# Patient Record
Sex: Female | Born: 1975
Health system: Southern US, Community
[De-identification: ages and names within clinical notes are randomized; demographics above are authoritative.]

## PROBLEM LIST (undated history)

## (undated) DIAGNOSIS — L709 Acne, unspecified: Secondary | ICD-10-CM

## (undated) HISTORY — PX: BUNIONECTOMY: SHX129

---

## 2000-11-07 ENCOUNTER — Encounter: Payer: Self-pay | Admitting: Family Medicine

## 2000-11-07 ENCOUNTER — Ambulatory Visit (HOSPITAL_COMMUNITY): Admission: RE | Admit: 2000-11-07 | Discharge: 2000-11-07 | Payer: Self-pay | Admitting: Family Medicine

## 2001-06-17 ENCOUNTER — Other Ambulatory Visit: Admission: RE | Admit: 2001-06-17 | Discharge: 2001-06-17 | Payer: Self-pay | Admitting: Obstetrics and Gynecology

## 2002-01-13 ENCOUNTER — Inpatient Hospital Stay (HOSPITAL_COMMUNITY): Admission: RE | Admit: 2002-01-13 | Discharge: 2002-01-14 | Payer: Self-pay | Admitting: Obstetrics and Gynecology

## 2002-07-02 HISTORY — PX: TUBAL LIGATION: SHX77

## 2002-10-16 ENCOUNTER — Ambulatory Visit (HOSPITAL_COMMUNITY): Admission: RE | Admit: 2002-10-16 | Discharge: 2002-10-16 | Payer: Self-pay | Admitting: Obstetrics & Gynecology

## 2005-02-10 ENCOUNTER — Emergency Department (HOSPITAL_COMMUNITY): Admission: EM | Admit: 2005-02-10 | Discharge: 2005-02-10 | Payer: Self-pay | Admitting: Family Medicine

## 2005-12-19 ENCOUNTER — Emergency Department (HOSPITAL_COMMUNITY): Admission: EM | Admit: 2005-12-19 | Discharge: 2005-12-19 | Payer: Self-pay | Admitting: Family Medicine

## 2005-12-20 ENCOUNTER — Ambulatory Visit (HOSPITAL_COMMUNITY): Admission: RE | Admit: 2005-12-20 | Discharge: 2005-12-20 | Payer: Self-pay | Admitting: Family Medicine

## 2006-08-05 ENCOUNTER — Other Ambulatory Visit: Admission: RE | Admit: 2006-08-05 | Discharge: 2006-08-05 | Payer: Self-pay | Admitting: Obstetrics and Gynecology

## 2006-10-08 ENCOUNTER — Ambulatory Visit (HOSPITAL_BASED_OUTPATIENT_CLINIC_OR_DEPARTMENT_OTHER): Admission: RE | Admit: 2006-10-08 | Discharge: 2006-10-08 | Payer: Self-pay | Admitting: Orthopedic Surgery

## 2007-07-17 ENCOUNTER — Ambulatory Visit (HOSPITAL_COMMUNITY): Admission: RE | Admit: 2007-07-17 | Discharge: 2007-07-17 | Payer: Self-pay | Admitting: Family Medicine

## 2008-02-06 ENCOUNTER — Emergency Department (HOSPITAL_COMMUNITY): Admission: EM | Admit: 2008-02-06 | Discharge: 2008-02-06 | Payer: Self-pay | Admitting: Emergency Medicine

## 2008-03-29 ENCOUNTER — Ambulatory Visit (HOSPITAL_COMMUNITY): Admission: RE | Admit: 2008-03-29 | Discharge: 2008-03-29 | Payer: Self-pay | Admitting: Family Medicine

## 2008-08-23 ENCOUNTER — Other Ambulatory Visit: Admission: RE | Admit: 2008-08-23 | Discharge: 2008-08-23 | Payer: Self-pay | Admitting: Obstetrics and Gynecology

## 2009-03-31 ENCOUNTER — Emergency Department (HOSPITAL_COMMUNITY): Admission: EM | Admit: 2009-03-31 | Discharge: 2009-03-31 | Payer: Self-pay | Admitting: Family Medicine

## 2010-11-17 NOTE — H&P (Signed)
[employer]  Patient:    [name], [name] Visit Number: [id number] MRN: [id number]          Service Type: MED Location: 4A A426 01 Attending Physician:  Tilda Burrow Dictated by:   Zerita Boers, C.N.M. Admit Date:  01/13/2002   CC:         [facility]   History and Physical  PROCEDURE:  DELIVERY NOTE  ADMISSION DIAGNOSIS:  Pregnancy at 38 weeks with active labor.  TIME OF DELIVERY:  January 13, 2002, at 10:45 a.m.  Length of first stage of labor was 6 hours and 30 minutes.  Length of second stage of labor was 15 minutes.  Length of third stage of labor was 5 minutes.  DESCRIPTION OF PROCEDURE:  The patient had a normal spontaneous vaginal delivery of a viable female infant. Upon delivery of infant, the infant had good tone, strong cry, pinked up very well.  Upon inspection of perineum, there was a first degree laceration with good hemostasis.  No surgical repair was necessary.  Third stage of labor was actually managed with 20 units of Pitocin and 1000 cc D5 lactated Ringers at a rapid rate.  Placenta was delivered spontaneously via Schultze mechanism.  Three-vessel cord was noted upon inspection.  Estimated blood loss approximately 350 cc.  Infant and mother tolerated the delivery well and were stabilized and transferred out to the postpartum unit in good condition. Dictated by:   Zerita Boers, C.N.M. Attending Physician:  Tilda Burrow DD:  01/13/02 TD:  01/13/02 Job: [phone] [id number]

## 2010-11-17 NOTE — Op Note (Signed)
   NAME:  Jennifer Hansen, Jennifer Hansen                            ACCOUNT NO.:  [id number]   MEDICAL RECORD NO.:  [id number]                   PATIENT TYPE:  AMB   LOCATION:  DAY                                  FACILITY:   PHYSICIAN:  Lazaro Arms, M.D.                DATE OF BIRTH:  1975-10-13   DATE OF PROCEDURE:  10/16/2002  DATE OF DISCHARGE:                                 OPERATIVE REPORT   PREOPERATIVE DIAGNOSES:  Multiparous female, desires permanent  sterilization.   POSTOPERATIVE DIAGNOSES:  Multiparous female, desires permanent  sterilization.   OPERATION/PROCEDURE:  Laparoscopic tubal.   SURGEON:  Lazaro Arms, M.D.   ANESTHESIA:  General endotracheal anesthesia.   FINDINGS:  The patient had normal uterus, tubes and ovaries.   DESCRIPTION OF PROCEDURE:  The patient was taken to the operating room and  placed in the supine position where she underwent general endotracheal  anesthesia.  She was placed in the low lithotomy position.  The urethra and  perineum and the vagina were prepped in the usual fashion.  A Hulka  tenaculum was placed in the uterus for manipulation and incision was made in  the umbilicus.  A Veress needle was used and abdomen was insufflated.  A 10-  11 trocar was placed in the  peritoneal cavity with one Scherman under direct  visualization without difficulty.  With a nonbladed obturator, the tubes  were identified, they were burned with no resistance and beyond using  electrocautery unit.  There was good hemostasis bilaterally.  Approximately  a 3 cm segment in the distal isthmic ampulla region of each tube was burned.  The gas was allowed to escape.  The instrument was removed and fascia was  closed with a single 0 Vicryl suture.  The skin was closed using Dermabond.  Marcaine 0.5% with epinephrine 1:200,000 was injected in the subcu for  postop pain relief.  The patient was awakened from anesthesia and taken to  the recovery room in good and stable  condition.  All counts were correct.                                               Lazaro Arms, M.D.    Loraine Maple  D:  10/16/2002  T:  10/16/2002  Job:  [id number]

## 2010-11-17 NOTE — H&P (Signed)
[facility]  Patient:    Jennifer Hansen, Jennifer Hansen Visit Number: [id number] MRN: [id number]          Service Type: Attending:  Christin Bach, M.D. Dictated by:   Zerita Boers, C.N.M. Adm. Date:  01/13/02   CC:         [facility]   History and Physical  REASON FOR ADMISSION:  Pregnancy at 38 weeks in active labor.  HISTORY OF PRESENT ILLNESS:  The patient is a 35 year old, Gravida 2, Para 1 with an EDC of January 26, 2002 which is consistent with a six week ultrasound, who started having contractions at 4 a.m. this morning.  She presents to the hospital 6 cm, completely effaced, 0 station, bulging membranes in active labor.  Fetal heart rate pattern is stable.  MEDICAL HISTORY:  Negative.  SURGICAL HISTORY:  Negative.  ALLERGIES:  She has no known allergies.  SOCIAL HISTORY:  She is married.  Her husband and family are supportive.  FAMILY HISTORY:  Positive for diabetes.  PRENATAL COURSE:  Essentially uneventful and high prior pregnancy.  At time of labor, she had a three hour labor with spontaneous vaginal delivery but did have a 10% abruption on exam of placenta post delivery.  PHYSICAL EXAMINATION:  VITAL SIGNS:  Stable.  Weight is approximately 162 pounds.  PELVIC:  Cervix is now 7 to 8 cm and fetal heart rate pattern is stable with accelerations noted.  LABORATORY DATA:  Blood type is A positive.  UDS is negative.  Rubella is immune. Hepatitis B surface antigen negative.  HIV negative.  Pap smear is class I.  GC and Chlamydia are both negative.  MS-AFP within normal limits.  PLAN:  We are going to admit.  Expect vaginal delivery. Dictated by:   Zerita Boers, C.N.M. Attending:  Christin Bach, M.D. DD:  01/13/02 TD:  01/13/02 Job: [id number]

## 2010-11-17 NOTE — Op Note (Signed)
NAME:  Jennifer Hansen, Jennifer Hansen                  ACCOUNT NO.:  [id number]   MEDICAL RECORD NO.:  [id number]          PATIENT TYPE:  AMB   LOCATION:  DSC                          FACILITY:  [facility]   PHYSICIAN:  Leonides Grills, M.D.     DATE OF BIRTH:  30-Mar-1976   DATE OF PROCEDURE:  10/08/2006  DATE OF DISCHARGE:                               OPERATIVE REPORT   PREOPERATIVE DIAGNOSIS:  Bilateral hallucis valgus.   POSTOPERATIVE DIAGNOSIS:  Bilateral hallucis valgus.   OPERATION:  1. Bilateral chevron bunionectomy.  2. Stress x-rays bilateral feet.   ANESTHESIA:  General.   SURGEON:  Leonides Grills, M.D.   ASSISTANT:  None.   COMPLICATIONS:  None.   DISPOSITION:  Stable to the PR.   INDICATIONS:  This is a 35 year old female with bilateral hallucis  valgus pain that was interfering with her life. __________  despite  wearing wider toe box shoes and occasional anti-inflammatories.  She was  consented for the above procedure.  All risks of infection,  neurovascular injury, nonunion, malunion, hardware irritation, hardware  failure, persistent pain, worse pain, prolonged recovery, stiffness,  arthritis, recurrence of hallucis valgus and hallux varus were all  explained.  Questions were encouraged and answered.   OPERATION:  The patient was brought to the operating room and placed in  the supine position, after adequate general endotracheal tube anesthesia  was administered, as well as Ancef 1 gram IV piggyback.  The bilateral  lower extremities were prepped and draped in a sterile manner, over a  proximally placed thigh tourniquet.  The limb was gravity exsanguinated.  The tourniquet was elevated to 290 mmHg.  A longitudinal incision  midline over the medial aspect of the left great toe MTP joint was then  made.  Dissection was carried down through the skin.  Hemostasis was  obtained.  Neurovascular structures identified both superiorly and  inferiorly, and protected throughout the case.   L-shaped capsulotomy was  then made.  A simple bunionectomy was then performed with a sagittal  saw.  Rocky Link Johnson's ridge was then rounded off with a rongeur.  The  lateral capsule was then released with a curved Beaver blade.  Center of  the head was identified.  Soft tissue was then protected both superiorly  and inferiorly, and a Chevron osteotomy was then created with the  sagittal saw.  The head was then transected by approximately 3-4 mm  laterally and then affixed with a 2.5-mm fully threaded cortical set  screw, using a 1.5-mm drill hole respectively.  This was countersunk.  This had excellent purchase and maintenance of the correction.  The  redundant bone medially was then trimmed off with the sagittal saw.  The  joint and area were copiously irrigated with normal saline.  The capsule  was then advanced both superiorly and proximally, and reconstructed with  2-0 Vicryl stitch.  This had an Conservation officer, historic buildings.  Range of motion of  the toe was excellent.  Stress x-rays were obtained in the AP and  lateral planes; showed no gross motion across the chevron site.  The  chevron osteotomy site fixation and proposition with alignment of the  sesamoids and MTP joint as well.  Tourniquet was deflated.  Hemostasis  was obtained.  The wound was copiously irrigated with normal saline.  The skin was closed with a 4-0 nylon stitch.  Sterile dressing was  applied.   The same exact procedure was performed to the right side.  Once  bilateral dressings were applied, hard sole shoes were applied.  The  patient was stable to the PR.   POSTOPERATIVE COURSE:  The patient is to follow up in 2 weeks.  At that  time we will remove the dressing as well as sutures.  We will place a  blunt silicone bunion pad between the first and second toes; larger  size.  Then start active-passive range of motion exercises of the great  toe MTP joint.  She is to remain heel weightbearing for a total of 6  weeks, and  then weightbearing as tolerated in a hard sole shoe for  additional 2 more weeks.  At 2 months postop, she will go into a normal  shoe and discontinue the bunion pad.  Elevation and ice were encouraged.      Leonides Grills, M.D.  Electronically Signed     PB/MEDQ  D:  10/08/2006  T:  10/09/2006  Job:  [id number]

## 2010-11-21 ENCOUNTER — Other Ambulatory Visit: Payer: Self-pay | Admitting: Adult Health

## 2010-11-21 ENCOUNTER — Other Ambulatory Visit (HOSPITAL_COMMUNITY)
Admission: RE | Admit: 2010-11-21 | Discharge: 2010-11-21 | Disposition: A | Discharge status: Home / Self Care | Payer: Self-pay | Source: Ambulatory Visit | Attending: Obstetrics and Gynecology | Admitting: Obstetrics and Gynecology

## 2010-11-21 ENCOUNTER — Encounter: Payer: Self-pay | Admitting: Adult Health

## 2010-11-21 DIAGNOSIS — Z01419 Encounter for gynecological examination (general) (routine) without abnormal findings: Secondary | ICD-10-CM | POA: Insufficient documentation

## 2010-11-21 NOTE — Progress Notes (Signed)
Subjective:     Patient ID: Jennifer Hansen, female   DOB: February 25, 1976, 35 y.o.   MRN: [id number]  Gynecologic Exam The patient's primary symptoms include genital itching and a vaginal discharge. This is a recurrent problem. The current episode started 1 to 4 weeks ago. The problem occurs intermittently. The problem has been waxing and waning. The patient is experiencing no pain. She is not pregnant. The symptoms are aggravated by nothing. She has tried nothing for the symptoms. She is sexually active. No, her partner does not have an STD. She uses tubal ligation for contraception. Her menstrual history has been regular. Her past medical history is significant for vaginosis.     Review of Systems  Constitutional: Negative.   Respiratory: Negative.   Cardiovascular: Negative.   Gastrointestinal: Negative.   Genitourinary: Positive for vaginal discharge.  Musculoskeletal: Negative.   Skin: Negative.        Objective:   Physical Exam  Constitutional: She appears well-developed and well-nourished.  Neck: Normal range of motion. Neck supple.  Cardiovascular: Normal rate and regular rhythm.   Pulmonary/Chest: Effort normal and breath sounds normal.  Abdominal: Soft. Bowel sounds are normal.  Genitourinary: Vaginal discharge found.  Musculoskeletal: Normal range of motion.  Neurological: She is alert.  Skin: Skin is warm and dry.   Breast exam no dominant mass, retraction or nipple discharge.    Assessment:  Yearly exam Yeast on wet prep    Plan:     rx given for Diflucan 150mg  #2 take 1 now and 1 in 3 days with 3 refills, try Preparation H if any rectal itching and use wet wipes, Mammogram at 40. Return to clinic in 1 year for physical and 2 years for pap and physical

## 2011-04-12 ENCOUNTER — Inpatient Hospital Stay (INDEPENDENT_AMBULATORY_CARE_PROVIDER_SITE_OTHER)
Admission: RE | Admit: 2011-04-12 | Discharge: 2011-04-12 | Disposition: A | Discharge status: Home / Self Care | Payer: 59 | Source: Ambulatory Visit | Attending: Emergency Medicine | Admitting: Emergency Medicine

## 2011-04-12 DIAGNOSIS — G44209 Tension-type headache, unspecified, not intractable: Secondary | ICD-10-CM

## 2012-11-27 ENCOUNTER — Encounter: Payer: Self-pay | Admitting: *Deleted

## 2012-11-28 ENCOUNTER — Other Ambulatory Visit (HOSPITAL_COMMUNITY)
Admission: RE | Admit: 2012-11-28 | Discharge: 2012-11-28 | Disposition: A | Discharge status: Home / Self Care | Payer: 59 | Source: Ambulatory Visit | Attending: Adult Health | Admitting: Adult Health

## 2012-11-28 ENCOUNTER — Ambulatory Visit (INDEPENDENT_AMBULATORY_CARE_PROVIDER_SITE_OTHER): Payer: 59 | Admitting: Adult Health

## 2012-11-28 ENCOUNTER — Encounter: Payer: Self-pay | Admitting: Adult Health

## 2012-11-28 VITALS — BP 102/78 | HR 74 | Ht 64.0 in | Wt 158.6 lb

## 2012-11-28 DIAGNOSIS — Z01419 Encounter for gynecological examination (general) (routine) without abnormal findings: Secondary | ICD-10-CM

## 2012-11-28 DIAGNOSIS — Z1151 Encounter for screening for human papillomavirus (HPV): Secondary | ICD-10-CM | POA: Insufficient documentation

## 2012-11-28 LAB — LIPID PANEL
Cholesterol: 184 mg/dL (ref 0–200)
Total CHOL/HDL Ratio: 2.9 Ratio

## 2012-11-28 LAB — COMPREHENSIVE METABOLIC PANEL
CO2: 27 mEq/L (ref 19–32)
Calcium: 9.3 mg/dL (ref 8.4–10.5)
Creat: 0.82 mg/dL (ref 0.50–1.10)
Glucose, Bld: 91 mg/dL (ref 70–99)
Sodium: 138 mEq/L (ref 135–145)
Total Bilirubin: 0.4 mg/dL (ref 0.3–1.2)
Total Protein: 6.9 g/dL (ref 6.0–8.3)

## 2012-11-28 LAB — CBC
Hemoglobin: 12.9 g/dL (ref 12.0–15.0)
MCH: 30.9 pg (ref 26.0–34.0)
MCV: 91.6 fL (ref 78.0–100.0)
RBC: 4.18 MIL/uL (ref 3.87–5.11)

## 2012-11-28 LAB — TSH: TSH: 0.715 u[IU]/mL (ref 0.350–4.500)

## 2012-11-28 NOTE — Patient Instructions (Addendum)
Call any problems Physical in 1 year Mammogram at 67

## 2012-11-28 NOTE — Progress Notes (Signed)
Patient ID: Jennifer Hansen, female   DOB: 1975-11-06, 37 y.o.   MRN: [id number] History of Present Illness: Jennifer Hansen is a 37 year old black female in for a pap and physical.  Current Medications, Allergies, Past Medical History, Past Surgical History, Family History and Social History were reviewed in [employer] electronic medical record.    Review of Systems: Patient denies any headaches, blurred vision, shortness of breath, chest pain, abdominal pain, problems with bowel movements, urination, or intercourse. No joint pain, no mood changes.  Physical Exam:Blood pressure 102/78, pulse 74, height 5\' 4"  (1.626 m), weight 158 lb 9.6 oz (71.94 kg), last menstrual period 11/16/2012. General:  Well developed, well nourished, no acute distress Skin:  Warm and dry Neck:  Midline trachea, normal thyroid Lungs; Clear to auscultation bilaterally Breast:  No dominant palpable mass, retraction, or nipple discharge Cardiovascular: Regular rate and rhythm Abdomen:  Soft, non tender, no hepatosplenomegaly Pelvic:  External genitalia is normal in appearance.  The vagina is normal in appearance. The cervix is bulbous.Pap performed with HPV.  Uterus is felt to be normal size, shape, and contour.  No adnexal masses or tenderness noted. Extremities:  No swelling or varicosities noted Psych:  Alert and cooperative and seems to be happy  Impression: Yearly gyn exam  Plan: Physical in 1 year Mammogram at 40 Check CBC,CMP,TSH, lipids and Vitamin D level

## 2012-12-05 ENCOUNTER — Telehealth: Payer: Self-pay | Admitting: Adult Health

## 2012-12-05 NOTE — Telephone Encounter (Signed)
Left message that pap was normal and labs were normal and she could look at them on my chart

## 2013-05-07 ENCOUNTER — Other Ambulatory Visit: Payer: Self-pay

## 2013-07-22 ENCOUNTER — Emergency Department (HOSPITAL_COMMUNITY)
Admission: EM | Admit: 2013-07-22 | Discharge: 2013-07-22 | Disposition: A | Discharge status: Home / Self Care | Payer: 59 | Source: Home / Self Care | Attending: Family Medicine | Admitting: Family Medicine

## 2013-07-22 ENCOUNTER — Encounter (HOSPITAL_COMMUNITY): Payer: Self-pay | Admitting: Emergency Medicine

## 2013-07-22 DIAGNOSIS — R071 Chest pain on breathing: Secondary | ICD-10-CM

## 2013-07-22 DIAGNOSIS — R0789 Other chest pain: Secondary | ICD-10-CM

## 2013-07-22 NOTE — ED Provider Notes (Signed)
CSN: [id number]     Arrival date & time 07/22/13  1749 History   First MD Initiated Contact with Patient 07/22/13 1814     Chief Complaint  Patient presents with  . Breast Pain   (Consider location/radiation/quality/duration/timing/severity/associated sxs/prior Treatment) Patient is a 38 y.o. female presenting with chest pain. The history is provided by the patient.  Chest Pain Pain location:  L lateral chest Pain quality: sharp   Pain radiates to:  Does not radiate Pain radiates to the back: no   Pain severity:  No pain Onset quality:  Gradual Duration:  2 weeks Timing:  Intermittent Progression:  Resolved Chronicity:  New Relieved by:  None tried Worsened by:  Nothing tried Ineffective treatments:  None tried Associated symptoms: no back pain, no cough and no shortness of breath     Past Medical History  Diagnosis Date  . BV (bacterial vaginosis)    Past Surgical History  Procedure Laterality Date  . Tubal ligation  2004  . Bunionectomy     Family History  Problem Relation Age of Onset  . Diabetes Paternal Grandmother   . Heart disease Paternal Grandmother   . Cancer Maternal Grandmother     colon cancer  . Diabetes Father    History  Substance Use Topics  . Smoking status: Never Smoker   . Smokeless tobacco: Never Used  . Alcohol Use: No   OB History   Grav Para Term Preterm Abortions TAB SAB Ect Mult Living   2 2             Review of Systems  Constitutional: Negative.   Respiratory: Negative for cough and shortness of breath.   Cardiovascular: Positive for chest pain.  Musculoskeletal: Negative for back pain.    Allergies  Review of patient's allergies indicates no known allergies.  Home Medications  No current outpatient prescriptions on file. BP 119/79  Pulse 72  Temp(Src) 98 F (36.7 C) (Oral)  Resp 18  SpO2 99%  LMP 07/15/2013 Physical Exam  Nursing note and vitals reviewed. Constitutional: She is oriented to person, place, and time.  She appears well-developed and well-nourished.  No pain at present.  Neck: Normal range of motion. Neck supple.  Cardiovascular: Normal rate, regular rhythm, normal heart sounds and intact distal pulses.   Pulmonary/Chest: Effort normal and breath sounds normal. She exhibits no tenderness.    Lymphadenopathy:    She has no cervical adenopathy.  Neurological: She is alert and oriented to person, place, and time.  Skin: Skin is warm and dry.    ED Course  Procedures (including critical care time) Labs Review Labs Reviewed - No data to display Imaging Review No results found.  EKG Interpretation    Date/Time:    Ventricular Rate:    PR Interval:    QRS Duration:   QT Interval:    QTC Calculation:   R Axis:     Text Interpretation:              MDM      Billy Fischer, MD 07/22/13 [phone]

## 2013-07-22 NOTE — ED Notes (Signed)
C/o left under breast pain and center of chest pain which started a week and a half now on and off Denies any therapy used as tx

## 2013-07-22 NOTE — Discharge Instructions (Signed)
See your doctor if further problems. °

## 2013-09-10 ENCOUNTER — Encounter: Payer: Self-pay | Admitting: *Deleted

## 2013-11-07 ENCOUNTER — Emergency Department (HOSPITAL_COMMUNITY)
Admission: EM | Admit: 2013-11-07 | Discharge: 2013-11-07 | Disposition: A | Discharge status: Home / Self Care | Payer: 59 | Source: Home / Self Care | Attending: Emergency Medicine | Admitting: Emergency Medicine

## 2013-11-07 ENCOUNTER — Encounter (HOSPITAL_COMMUNITY): Payer: Self-pay | Admitting: Emergency Medicine

## 2013-11-07 DIAGNOSIS — T783XXA Angioneurotic edema, initial encounter: Secondary | ICD-10-CM

## 2013-11-07 HISTORY — DX: Acne, unspecified: L70.9

## 2013-11-07 MED ORDER — METHYLPREDNISOLONE ACETATE 80 MG/ML IJ SUSP
80.0000 mg | Freq: Once | INTRAMUSCULAR | Status: AC
  Administered 2013-11-07: 80 mg via INTRAMUSCULAR

## 2013-11-07 MED ORDER — FEXOFENADINE HCL 180 MG PO TABS: 180.0000 mg | ORAL_TABLET | Freq: Every day | ORAL | Status: DC

## 2013-11-07 MED ORDER — DIPHENHYDRAMINE HCL 50 MG/ML IJ SOLN
INTRAMUSCULAR | Status: AC
  Filled 2013-11-07: qty 1

## 2013-11-07 MED ORDER — METHYLPREDNISOLONE ACETATE 80 MG/ML IJ SUSP
INTRAMUSCULAR | Status: AC
  Filled 2013-11-07: qty 1

## 2013-11-07 MED ORDER — PREDNISONE 20 MG PO TABS: ORAL_TABLET | ORAL | Status: DC

## 2013-11-07 MED ORDER — RANITIDINE HCL 150 MG PO TABS: 150.0000 mg | ORAL_TABLET | Freq: Two times a day (BID) | ORAL | Status: DC

## 2013-11-07 MED ORDER — DIPHENHYDRAMINE HCL 50 MG/ML IJ SOLN
50.0000 mg | Freq: Once | INTRAMUSCULAR | Status: AC
  Administered 2013-11-07: 50 mg via INTRAMUSCULAR

## 2013-11-07 NOTE — Discharge Instructions (Signed)
Angioedema Angioedema is a sudden swelling of tissues, often of the skin. It can occur on the face or genitals or in the abdomen or other body parts. The swelling usually develops over a short period and gets better in 24 to 48 hours. It often begins during the night and is found when the person wakes up. The person may also get red, itchy patches of skin (hives). Angioedema can be dangerous if it involves swelling of the air passages.  Depending on the cause, episodes of angioedema may only happen once, come back in unpredictable patterns, or repeat for several years and then gradually fade away.  CAUSES  Angioedema can be caused by an allergic reaction to various triggers. It can also result from nonallergic causes, including reactions to drugs, immune system disorders, viral infections, or an abnormal gene that is passed to you from your parents (hereditary). For some people with angioedema, the cause is unknown.  Some things that can trigger angioedema include:   Foods.   Medicines, such as ACE inhibitors, ARBs, nonsteroidal anti-inflammatory agents, or estrogen.   Latex.   Animal saliva.   Insect stings.   Dyes used in X-rays.   Mild injury.   Dental work.  Surgery.  Stress.   Sudden changes in temperature.   Exercise. SIGNS AND SYMPTOMS   Swelling of the skin.  Hives. If these are present, there is also intense itching.  Redness in the affected area.   Pain in the affected area.  Swollen lips or tongue.  Breathing problems. This may happen if the air passages swell.  Wheezing. If internal organs are involved, there may be:   Nausea.   Abdominal pain.   Vomiting.   Difficulty swallowing.   Difficulty passing urine. DIAGNOSIS   Your health care provider will examine the affected area and take a medical and family history.  Various tests may be done to help determine the cause. Tests may include:  Allergy skin tests to see if the problem  is an allergic reaction.   Blood tests to check for hereditary angioedema.   Tests to check for underlying diseases that could cause the condition.   A review of your medicines, including over the counter medicines, may be done. TREATMENT  Treatment will depend on the cause of the angioedema. Possible treatments include:   Removal of anything that triggered the condition (such as stopping certain medicines).   Medicines to treat symptoms or prevent attacks. Medicines given may include:   Antihistamines.   Epinephrine injection.   Steroids.   Hospitalization may be required for severe attacks. If the air passages are affected, it can be an emergency. Tubes may need to be placed to keep the airway open. HOME CARE INSTRUCTIONS   Only take over-the-counter or prescription medicines as directed by your health care provider.  If you were given medicines for emergency allergy treatment, always carry them with you.  Wear a medical bracelet as directed by your health care provider.   Avoid known triggers. SEEK MEDICAL CARE IF:   You have repeat attacks of angioedema.   Your attacks are more frequent or more severe despite preventive measures.   You have hereditary angioedema and are considering having children. It is important to discuss the risks of passing the condition on to your children with your health care provider. SEEK IMMEDIATE MEDICAL CARE IF:   You have severe swelling of the mouth, tongue, or lips.  You have difficulty breathing.   You have difficulty swallowing.     You faint. MAKE SURE YOU:  Understand these instructions.  Will watch your condition.  Will get help right away if you are not doing well or get worse. Document Released: 08/27/2001 Document Revised: 04/08/2013 Document Reviewed: 02/09/2013 [employer] Patient Information 2014 [employer].  

## 2013-11-07 NOTE — ED Notes (Signed)
Pt   Started  Taking   Anti  Biotics    sev  Weeks  Ago    For      Acne    Pt  Reports  Itching      Started  Last     Night            No  Rash  No  New           meds  Or  Any  Known causative         Agent         Pt  Did  Not  Some  swlling  r  Knee      And  Swelling  On  Some    Joint

## 2013-11-07 NOTE — ED Provider Notes (Signed)
  Chief Complaint    Chief Complaint  Patient presents with  . Pruritis    History of Present Illness      Jennifer Hansen is a 38 year old female who has had generalized itching and swollen areas for the past 2 days. The swollen areas are behind the ears, over the right knee and left ankle. She denies any rash. She's had no swelling of the face, no difficulty breathing or coughing or wheezing, no swelling of lips, tongue, or throat. His scars exposures ago, her only new medication is minocycline which her dermatologist gave her 4 her complexion. She's been on this for just 18 days.  Review of Systems   Other than as noted above, the patient denies any of the following symptoms: Systemic:  No fever or chills. ENT:  No nasal congestion, rhinorrhea, sore throat, swelling of lips, tongue or throat. Resp:  No cough, wheezing, or shortness of breath.  [place]    Past medical history, family history, social history, meds, and allergies were reviewed.   Physical Exam     Vital signs:  BP 114/58  Pulse 79  Temp(Src) 99.5 F (37.5 C) (Oral)  Resp 12  SpO2 100%  LMP 10/17/2013 Gen:  Alert, oriented, in no distress. ENT:  Pharynx clear, no intraoral lesions, moist mucous membranes. Lungs:  Clear to auscultation. Skin:  There was erythema and swelling behind both ears and some slight swelling of the right knee and left ankle.  Course in [facility] [place]     The following meds were given:  Medications  methylPREDNISolone acetate (DEPO-MEDROL) injection 80 mg (80 mg Intramuscular Given 11/07/13 1257)  diphenhydrAMINE (BENADRYL) injection 50 mg (50 mg Intramuscular Given 11/07/13 1257)    Assessment    The encounter diagnosis was Angio-edema.  Probably due to allergic reaction to minocycline. The patient was told to stop the minocycline and to contact her dermatologist about alternative medication.  Plan     1.  Meds:  The following meds were prescribed:   Discharge Medication List as  of 11/07/2013 12:54 PM    START taking these medications   Details  fexofenadine (ALLEGRA) 180 MG tablet Take 1 tablet (180 mg total) by mouth daily., Starting 11/07/2013, Until Discontinued, Normal    predniSONE (DELTASONE) 20 MG tablet Take 3 daily for 5 days, 2 daily for 5 days, 1 daily for 5 days., Normal    ranitidine (ZANTAC) 150 MG tablet Take 1 tablet (150 mg total) by mouth 2 (two) times daily., Starting 11/07/2013, Until Discontinued, Normal        2.  Patient Education/Counseling:  The patient was given appropriate handouts, self care instructions, and instructed in symptomatic relief.    3.  Follow up:  The patient was told to follow up here if no better in 3 to 4 days, or sooner if becoming worse in any way, and given some red flag symptoms such as worsening rash, fever, or difficulty breathing which would prompt immediate return.  Follow up here if necessary.      Harden Mo, MD 11/07/13 [phone]

## 2013-11-30 ENCOUNTER — Ambulatory Visit (INDEPENDENT_AMBULATORY_CARE_PROVIDER_SITE_OTHER): Payer: 59 | Admitting: Adult Health

## 2013-11-30 ENCOUNTER — Encounter: Payer: Self-pay | Admitting: Adult Health

## 2013-11-30 VITALS — BP 112/76 | HR 76 | Ht 64.0 in | Wt 158.0 lb

## 2013-11-30 DIAGNOSIS — Z01419 Encounter for gynecological examination (general) (routine) without abnormal findings: Secondary | ICD-10-CM

## 2013-11-30 NOTE — Progress Notes (Signed)
Patient ID: Jennifer Hansen, female   DOB: [birth year], 38 y.o.   MRN: [id number] History of Present Illness:  Jennifer Hansen is a 38 year old black female in for a physical, she had a normal pap with negative HPV 11/28/12.  Current Medications, Allergies, Past Medical History, Past Surgical History, Family History and Social History were reviewed in [employer] record.     Review of Systems: Patient denies any headaches, blurred vision, shortness of breath, chest pain, abdominal pain, problems with bowel movements, urination, or intercourse. No joint pain or mood swings, has had flutter near left breast at times.    Physical Exam:BP 112/76  Pulse 76  Ht 5\' 4"  (1.626 m)  Wt 158 lb (71.668 kg)  BMI 27.11 kg/m2  LMP 11/16/2013 General:  Well developed, well nourished, no acute distress Skin:  Warm and dry Neck:  Midline trachea, normal thyroid Lungs; Clear to auscultation bilaterally Breast:  No dominant palpable mass, retraction, or nipple discharge Cardiovascular: Regular rate and rhythm Abdomen:  Soft, non tender, no hepatosplenomegaly Pelvic:  External genitalia is normal in appearance.  The vagina is normal in appearance.  The cervix is bulbous.  Uterus is felt to be normal size, shape, and contour.  No adnexal masses or tenderness noted. Extremities:  No swelling or varicosities noted Psych:  No mood changes, alert and cooperative,seems happy   Impression: Yearly gyn exam no pap    Plan: Physical in 1 year Pap in 20117 Mammogram at 38 Keep event diary of flutter, when it happens and any association with caffeine, stress activity and call if increases

## 2013-11-30 NOTE — Patient Instructions (Signed)
Physical in 1 year Pap 2017 Mammogram at 10

## 2014-04-16 ENCOUNTER — Other Ambulatory Visit: Payer: Self-pay

## 2014-05-03 ENCOUNTER — Encounter: Payer: Self-pay | Admitting: Adult Health

## 2014-11-23 ENCOUNTER — Ambulatory Visit (INDEPENDENT_AMBULATORY_CARE_PROVIDER_SITE_OTHER): Payer: 59 | Admitting: Family Medicine

## 2014-11-23 ENCOUNTER — Encounter: Payer: Self-pay | Admitting: Family Medicine

## 2014-11-23 ENCOUNTER — Encounter: Payer: 59 | Admitting: Family Medicine

## 2014-11-23 VITALS — BP 110/64 | HR 72 | Temp 98.4°F | Resp 14 | Ht 64.0 in | Wt 158.0 lb

## 2014-11-23 DIAGNOSIS — M25441 Effusion, right hand: Secondary | ICD-10-CM

## 2014-11-23 MED ORDER — MELOXICAM 7.5 MG PO TABS: 7.5000 mg | ORAL_TABLET | Freq: Every day | ORAL | Status: DC

## 2014-11-23 NOTE — Patient Instructions (Signed)
Take mobic for inflammation Get xray done at work I will call with results F/u AS NEEDED

## 2014-11-23 NOTE — Progress Notes (Signed)
Patient ID: Jennifer Hansen, female   DOB: March 02, 1976, 39 y.o.   MRN: [id number]   Subjective:    Patient ID: Jennifer Hansen, female    DOB: January 24, 1976, 39 y.o.   MRN: [id number]  Patient presents for New Patient- Establish Care and R Index Finger Edema  patient here to establish care. He was last seen by [facility] with did not follow for any chronic medical problems. She does follow with [facility].  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 Her husband and children are also patients of mine. She has no particular past medical history her only surgery was a bunionectomy. She is here today with concern for swelling of her right index finger on and off for the past 6 months. She does do a lot of computer work therefore she did not know if this was attributed. There is no family history of rheumatoid arthritis only osteoarthritis. The swelling is mostly at her knuckle she gets pain all the way up to the middle upper finger. This is the only isolated finger. No other joint swellings. He has not taken any medications over-the-counter for this.  Her stiffness and pain is worse in the morning time when she first gets up.  I reviewed labs from May 2014 which were unremarkable including lipid panel.    Her immunizations are up-to-date    Family history reviewed  Review Of Systems:  GEN- denies fatigue, fever, weight loss,weakness, recent illness HEENT- denies eye drainage, change in vision, nasal discharge, CVS- denies chest pain, palpitations RESP- denies SOB, cough, wheeze ABD- denies N/V, change in stools, abd pain GU- denies dysuria, hematuria, dribbling, incontinence MSK- +joint pain, muscle aches, injury Neuro- denies headache, dizziness, syncope, seizure activity       Objective:    BP 110/64 mmHg  Pulse 72  Temp(Src) 98.4 F (36.9 C) (Oral)  Resp 14  Ht 5\' 4"  (1.626 m)  Wt 158 lb (71.668 kg)  BMI 27.11 kg/m2  LMP 10/24/2014 (Approximate) GEN- NAD, alert and oriented  x3 HEENT- PERRL, EOMI, non injected sclera, pink conjunctiva, MMM, oropharynx clear Neck- Supple, no thyromegaly CVS- RRR, no murmur RESP-CTAB MSK- FROM upper ext, Lower ext, Right hand- swelling at PIP/Knuckle and MIP, TTP over both regions, mild TTP 3rd digit PIP, able to make fist, FROM, wrist EXT- No edema Psych- normal affect and mood Pulses- Radial, DP- 2+        Assessment & Plan:      Problem List Items Addressed This Visit    None    Visit Diagnoses    Finger joint swelling, right    -  Primary    No specific injury to finger, ? arthritis and erosions based on apperance, will xray, start mobic for inflammation , pending results may need RF done    Relevant Orders    DG Hand Complete Right       Note: This dictation was prepared with Dragon dictation along with smaller phrase technology. Any transcriptional errors that result from this process are unintentional.

## 2014-11-24 ENCOUNTER — Ambulatory Visit (HOSPITAL_COMMUNITY)
Admission: RE | Admit: 2014-11-24 | Discharge: 2014-11-24 | Disposition: A | Discharge status: Home / Self Care | Payer: 59 | Source: Ambulatory Visit | Attending: Family Medicine | Admitting: Family Medicine

## 2014-11-24 DIAGNOSIS — M7989 Other specified soft tissue disorders: Secondary | ICD-10-CM | POA: Diagnosis not present

## 2014-11-24 DIAGNOSIS — M79641 Pain in right hand: Secondary | ICD-10-CM | POA: Diagnosis present

## 2014-11-24 DIAGNOSIS — M25441 Effusion, right hand: Secondary | ICD-10-CM

## 2015-04-02 ENCOUNTER — Encounter: Payer: Self-pay | Admitting: Family Medicine

## 2015-07-28 DIAGNOSIS — H5213 Myopia, bilateral: Secondary | ICD-10-CM | POA: Diagnosis not present

## 2015-10-24 ENCOUNTER — Ambulatory Visit (INDEPENDENT_AMBULATORY_CARE_PROVIDER_SITE_OTHER): Payer: 59

## 2015-10-24 ENCOUNTER — Ambulatory Visit (INDEPENDENT_AMBULATORY_CARE_PROVIDER_SITE_OTHER): Payer: 59 | Admitting: Podiatry

## 2015-10-24 ENCOUNTER — Encounter: Payer: Self-pay | Admitting: Podiatry

## 2015-10-24 VITALS — BP 132/83 | HR 70 | Resp 18

## 2015-10-24 DIAGNOSIS — M21619 Bunion of unspecified foot: Secondary | ICD-10-CM | POA: Diagnosis not present

## 2015-10-24 DIAGNOSIS — M898X9 Other specified disorders of bone, unspecified site: Secondary | ICD-10-CM | POA: Diagnosis not present

## 2015-10-24 NOTE — Patient Instructions (Signed)

## 2015-10-24 NOTE — Progress Notes (Signed)
   Subjective:    Patient ID: Jennifer Hansen, female    DOB: 1976-02-01, 40 y.o.   MRN: [id number]  HPI  40 year old female presents the office concerns of pain along the bunion on her right foot. She states that she had the bunions removed several years ago however over the last 2 years she's had numbness in increased pain along the bunion site on the right side. She states area feels jagged. She's had difficulty wearing shoe gear. She has tried changing shoes as well as padding without any relief. No tingling or numbness. No swelling or redness. No recent injury or trauma.   Review of Systems  All other systems reviewed and are negative.      Objective:   Physical Exam General: AAO x3, NAD  Dermatological: Skin is warm, dry and supple bilateral. Nails x 10 are well manicured; remaining integument appears unremarkable at this time. There are no open sores, no preulcerative lesions, no rash or signs of infection present.  Vascular: Dorsalis Pedis artery and Posterior Tibial artery pedal pulses are 2/4 bilateral with immedate capillary fill time. Pedal hair growth present. No varicosities and no lower extremity edema present bilateral. There is no pain with calf compression, swelling, warmth, erythema.   Neruologic: Grossly intact via light touch bilateral. Vibratory intact via tuning fork bilateral. Protective threshold with Semmes Wienstein monofilament intact to all pedal sites bilateral. Patellar and Achilles deep tendon reflexes 2+ bilateral. No Babinski or clonus noted bilateral.   Musculoskeletal: There is mild prominence on the dorsal medial aspect of the right foot on the first metatarsal overlying the bunion prominence. There is slight irritation from shoe gear. There is no pain or crepitation first MTPJ range of motion. No pain with first MTPJ range of motion. Mild tenderness at the overlying the bunion site. No other areas of tenderness bilateral.  Gait: Unassisted, Nonantalgic.     Assessment & Plan:  40 year old female right bunion/exostosis -Treatment options discussed including all alternatives, risks, and complications -X-rays were obtained and reviewed with the patient. Hardware intact prior surgery. On the medial aspect of the right first metatarsal head area appears to be irregular and there is bone spur formation. -Discussed both conservative and surgical treatment options. Discussed shoe gear modification, padding, offloading for which she's been doing without any relief. Discussed the surgical intervention including exostectomy. After that a exostectomy will help remove the jagged bone spurs and lead her symptoms. She wishes to proceed with this. -The incision placement as well as the postoperative course was discussed with the patient. I discussed risks of the surgery which include, but not limited to, infection, bleeding, pain, swelling, need for further surgery, delayed or nonhealing, painful or ugly scar, numbness or sensation changes, over/under correction, recurrence, transfer lesions, further deformity, hardware failure, DVT/PE, loss of toe/foot. Patient understands these risks and wishes to proceed with surgery. The surgical consent was reviewed with the patient all 3 pages were signed. No promises or guarantees were given to the outcome of the procedure. All questions were answered to the best of my ability. Before the surgery the patient was encouraged to call the office if there is any further questions. The surgery will be performed at the [facility] on an outpatient basis.  Celesta Gentile, DPM

## 2015-11-09 ENCOUNTER — Encounter: Payer: Self-pay | Admitting: Podiatry

## 2015-11-09 DIAGNOSIS — Z01818 Encounter for other preprocedural examination: Secondary | ICD-10-CM | POA: Diagnosis not present

## 2015-11-09 DIAGNOSIS — M2011 Hallux valgus (acquired), right foot: Secondary | ICD-10-CM | POA: Diagnosis not present

## 2015-11-09 DIAGNOSIS — M21611 Bunion of right foot: Secondary | ICD-10-CM | POA: Diagnosis not present

## 2015-11-09 MED FILL — HYDROCODON-APAP 5-325: 5-325 | 5 days supply | Qty: 30 | Fill #0

## 2015-11-09 MED FILL — PROMETHAZINE 25 MG TABLET: 25 | 3 days supply | Qty: 10 | Fill #0

## 2015-11-09 MED FILL — CEPHALEXIN 500 MG CAPSULE: 500 | 7 days supply | Qty: 21 | Fill #0

## 2015-11-10 ENCOUNTER — Telehealth: Payer: Self-pay | Admitting: *Deleted

## 2015-11-10 NOTE — Telephone Encounter (Addendum)
Post op courtesy call-Pt states she's doing great.  I told her I needed to go over her surgery instruction briefly. I instructed pt in RICE, to remain in the boot at all times even to sleep, could open boot to ice, and rotate foot and ankle, then put foot back in to the boot, leave the original surgical dressing in place, clean and dry until changed by Dr. Jacqualyn Posey at the 1st POV, and take the pain medication as instructed, and call with concerns.  Pt states understanding.  11/14/2015-Pt states the surgical shoe is pulling a the ace and she doesn't want it to come loose before the POV.  I told pt she could cover the entire foot and ace with a very loose athletic sock, and if it came loose rewrap from the toes up the leg.  I told pt that often too much activity would cause rubbing and slipping of the dressing, that she may need to be resting more.

## 2015-11-11 NOTE — Progress Notes (Signed)
DOS 11/09/2015 Right foot removal of bone spur with bunion.

## 2015-11-14 ENCOUNTER — Encounter: Payer: Self-pay | Admitting: Podiatry

## 2015-11-17 ENCOUNTER — Encounter: Payer: Self-pay | Admitting: Podiatry

## 2015-11-18 ENCOUNTER — Ambulatory Visit (INDEPENDENT_AMBULATORY_CARE_PROVIDER_SITE_OTHER): Payer: 59 | Admitting: Podiatry

## 2015-11-18 ENCOUNTER — Encounter: Payer: Self-pay | Admitting: Podiatry

## 2015-11-18 ENCOUNTER — Ambulatory Visit (INDEPENDENT_AMBULATORY_CARE_PROVIDER_SITE_OTHER): Payer: 59

## 2015-11-18 VITALS — BP 124/85 | HR 81 | Resp 18

## 2015-11-18 DIAGNOSIS — M898X9 Other specified disorders of bone, unspecified site: Secondary | ICD-10-CM

## 2015-11-18 DIAGNOSIS — M21619 Bunion of unspecified foot: Secondary | ICD-10-CM

## 2015-11-18 DIAGNOSIS — Z09 Encounter for follow-up examination after completed treatment for conditions other than malignant neoplasm: Secondary | ICD-10-CM

## 2015-11-18 NOTE — Progress Notes (Signed)
Patient ID: Jennifer Hansen, female   DOB: 10/10/1975, 40 y.o.   MRN: [id number]  Subjective: Jennifer Hansen is a 40 y.o. is seen today in office s/p right Silver bunionectomy preformed on 11/09/15. Since that overall she is doing better. She is not taking pain medicine. She is going to [place] this afternoon she is requesting a boot for when she goes. Denies any systemic complaints such as fevers, chills, nausea, vomiting. No calf pain, chest pain, shortness of breath.   Objective: General: No acute distress, AAOx3  DP/PT pulses palpable 2/4, CRT < 3 sec to all digits.  Protective sensation intact. Motor function intact.  Right foot: Incision is well coapted without any evidence of dehiscence and sutures intact. There is no surrounding erythema, ascending cellulitis, fluctuance, crepitus, malodor, drainage/purulence. There is mild edema around the surgical site. There is slight discomfort along the surgical site.  No other areas of tenderness to bilateral lower extremities.  No other open lesions or pre-ulcerative lesions.  No pain with calf compression, swelling, warmth, erythema.   Assessment and Plan:  Status post right Silver bunionectomy, doing well with no complications   -Treatment options discussed including all alternatives, risks, and complications -X-rays were obtained and reviewed with the patient. No evidence of acute fracture or stress fracture. -Ice/elevation -Pain medication as needed. -Cam boot dispensed today. -Monitor for any clinical signs or symptoms of infection and DVT/PE and directed to call the office immediately should any occur or go to the ER. -Follow-up in 1 week for suture removal or sooner if any problems arise. In the meantime, encouraged to call the office with any questions, concerns, change in symptoms.   Celesta Gentile, DPM

## 2015-11-25 ENCOUNTER — Ambulatory Visit (INDEPENDENT_AMBULATORY_CARE_PROVIDER_SITE_OTHER): Payer: 59 | Admitting: Podiatry

## 2015-11-25 DIAGNOSIS — M21619 Bunion of unspecified foot: Secondary | ICD-10-CM

## 2015-11-25 DIAGNOSIS — Z09 Encounter for follow-up examination after completed treatment for conditions other than malignant neoplasm: Secondary | ICD-10-CM

## 2015-12-01 NOTE — Progress Notes (Signed)
Patient ID: ELENIE KONTOS, female   DOB: 11-27-1975, 40 y.o.   MRN: [id number]  Subjective: ZOEIE SOTELO is a 40 y.o. is seen today in office s/p right Silver bunionectomy preformed on 11/09/15. She states that she is doing much no pain. She is continue weightbearing as tolerated in cam walker. Denies any systemic complaints such as fevers, chills, nausea, vomiting. No calf pain, chest pain, shortness of breath.   Objective: General: No acute distress, AAOx3  DP/PT pulses palpable 2/4, CRT < 3 sec to all digits.  Protective sensation intact. Motor function intact.  Right foot: Incision is well coapted without any evidence of dehiscence and sutures intact. There is no surrounding erythema, ascending cellulitis, fluctuance, crepitus, malodor, drainage/purulence. There is decreased edema around the surgical site. There is slight discomfort along the surgical site.  No other areas of tenderness to bilateral lower extremities.  No other open lesions or pre-ulcerative lesions.  No pain with calf compression, swelling, warmth, erythema.   Assessment and Plan:  Status post right Silver bunionectomy, doing well with no complications   -Treatment options discussed including all alternatives, risks, and complications -Sutures removed. Continue with antibiotic ointment over the incision daily. She has her to shower. -Ice/elevation -Pain medication as needed. -She inserted transition to regular shoe as tolerated. Discussed gradual transition. -Monitor for any clinical signs or symptoms of infection and DVT/PE and directed to call the office immediately should any occur or go to the ER. -Follow-up as scheduled or sooner if any problems arise. In the meantime, encouraged to call the office with any questions, concerns, change in symptoms.   Celesta Gentile, DPM

## 2016-01-04 MED FILL — FLUOCINOLONE 0.01% SCALP OI: 0.01 | 30 days supply | Qty: 118 | Fill #2

## 2016-04-02 MED FILL — EPIDUO FORTE 0.3-2.5% GEL P: 0.3-2.5 | 30 days supply | Qty: 45 | Fill #1

## 2016-04-13 ENCOUNTER — Encounter: Payer: Self-pay | Admitting: Family Medicine

## 2016-05-10 ENCOUNTER — Telehealth: Payer: 59 | Admitting: Physician Assistant

## 2016-05-10 DIAGNOSIS — B9789 Other viral agents as the cause of diseases classified elsewhere: Secondary | ICD-10-CM | POA: Diagnosis not present

## 2016-05-10 DIAGNOSIS — J069 Acute upper respiratory infection, unspecified: Secondary | ICD-10-CM

## 2016-05-10 MED ORDER — BENZONATATE 100 MG PO CAPS: 100.0000 mg | ORAL_CAPSULE | Freq: Three times a day (TID) | ORAL | 0 refills | Status: DC | PRN

## 2016-05-10 MED FILL — BENZONATATE 100 MG CAPSULE: 100 | 10 days supply | Qty: 30 | Fill #0

## 2016-05-10 NOTE — Progress Notes (Signed)
We are sorry that you are not feeling well.  Here is how we plan to help!  Based on what you have shared with me it looks like you have upper respiratory tract inflammation that has resulted in a significant cough.  Inflammation and infection in the upper respiratory tract is commonly called bronchitis and has four common causes:  Allergies, Viral Infections, Acid Reflux and Bacterial Infections.  Allergies, viruses and acid reflux are treated by controlling symptoms or eliminating the cause. An example might be a cough caused by taking certain blood pressure medications. You stop the cough by changing the medication. Another example might be a cough caused by acid reflux. Controlling the reflux helps control the cough.  Based on your presentation I believe you most likely have A cough due to a virus.  This is called viral bronchitis and is best treated by rest, plenty of fluids and control of the cough.  You may use Ibuprofen or Tylenol as directed to help your symptoms.     In addition you may use A prescription cough medication called Tessalon Perles 100mg. You may take 1-2 capsules every 8 hours as needed for your cough.  USE OF BRONCHODILATOR ("RESCUE") INHALERS: There is a risk from using your bronchodilator too frequently.  The risk is that over-reliance on a medication which only relaxes the muscles surrounding the breathing tubes can reduce the effectiveness of medications prescribed to reduce swelling and congestion of the tubes themselves.  Although you feel brief relief from the bronchodilator inhaler, your asthma may actually be worsening with the tubes becoming more swollen and filled with mucus.  This can delay other crucial treatments, such as oral steroid medications. If you need to use a bronchodilator inhaler daily, several times per day, you should discuss this with your provider.  There are probably better treatments that could be used to keep your asthma under control.     HOME  CARE . Only take medications as instructed by your medical team. . Complete the entire course of an antibiotic. . Drink plenty of fluids and get plenty of rest. . Avoid close contacts especially the very young and the elderly . Cover your mouth if you cough or cough into your sleeve. . Always remember to wash your hands . A steam or ultrasonic humidifier can help congestion.   GET HELP RIGHT AWAY IF: . You develop worsening fever. . You become short of breath . You cough up blood. . Your symptoms persist after you have completed your treatment plan MAKE SURE YOU   Understand these instructions.  Will watch your condition.  Will get help right away if you are not doing well or get worse.  Your e-visit answers were reviewed by a board certified advanced clinical practitioner to complete your personal care plan.  Depending on the condition, your plan could have included both over the counter or prescription medications. If there is a problem please reply  once you have received a response from your provider. Your safety is important to us.  If you have drug allergies check your prescription carefully.    You can use MyChart to ask questions about today's visit, request a non-urgent call back, or ask for a work or school excuse for 24 hours related to this e-Visit. If it has been greater than 24 hours you will need to follow up with your provider, or enter a new e-Visit to address those concerns. You will get an e-mail in the next two days   asking about your experience.  I hope that your e-visit has been valuable and will speed your recovery. Thank you for using e-visits.   

## 2016-05-22 ENCOUNTER — Encounter: Payer: Self-pay | Admitting: Family Medicine

## 2016-06-04 ENCOUNTER — Ambulatory Visit (INDEPENDENT_AMBULATORY_CARE_PROVIDER_SITE_OTHER): Payer: 59 | Admitting: Family Medicine

## 2016-06-04 ENCOUNTER — Encounter: Payer: Self-pay | Admitting: Family Medicine

## 2016-06-04 VITALS — BP 120/72 | HR 76 | Temp 98.9°F | Resp 14 | Ht 64.0 in | Wt 157.0 lb

## 2016-06-04 DIAGNOSIS — Z23 Encounter for immunization: Secondary | ICD-10-CM | POA: Diagnosis not present

## 2016-06-04 DIAGNOSIS — Z1231 Encounter for screening mammogram for malignant neoplasm of breast: Secondary | ICD-10-CM

## 2016-06-04 DIAGNOSIS — Z124 Encounter for screening for malignant neoplasm of cervix: Secondary | ICD-10-CM

## 2016-06-04 DIAGNOSIS — Z1321 Encounter for screening for nutritional disorder: Secondary | ICD-10-CM | POA: Diagnosis not present

## 2016-06-04 DIAGNOSIS — Z0000 Encounter for general adult medical examination without abnormal findings: Secondary | ICD-10-CM

## 2016-06-04 DIAGNOSIS — Z1239 Encounter for other screening for malignant neoplasm of breast: Secondary | ICD-10-CM

## 2016-06-04 LAB — LIPID PANEL
CHOL/HDL RATIO: 3.1 ratio (ref <5.0)
Cholesterol: 194 mg/dL (ref <200)
HDL: 62 mg/dL (ref >50)
LDL Cholesterol: 118 mg/dL — ABNORMAL HIGH (ref <100)
Triglycerides: 68 mg/dL (ref <150)
VLDL: 14 mg/dL (ref <30)

## 2016-06-04 LAB — CBC WITH DIFFERENTIAL/PLATELET
BASOS PCT: 1 %
Basophils Absolute: 42 cells/uL (ref 0–200)
EOS ABS: 84 {cells}/uL (ref 15–500)
Eosinophils Relative: 2 %
HCT: 38.6 % (ref 35.0–45.0)
Hemoglobin: 12.6 g/dL (ref 12.0–15.0)
Lymphocytes Relative: 54 %
Lymphs Abs: 2268 cells/uL (ref 850–3900)
MCH: 31.4 pg (ref 27.0–33.0)
MCHC: 32.6 g/dL (ref 32.0–36.0)
MCV: 96.3 fL (ref 80.0–100.0)
MONOS PCT: 14 %
MPV: 9.9 fL (ref 7.5–12.5)
Monocytes Absolute: 588 cells/uL (ref 200–950)
NEUTROS ABS: 1218 {cells}/uL — AB (ref 1500–7800)
Neutrophils Relative %: 29 %
PLATELETS: 285 10*3/uL (ref 140–400)
RBC: 4.01 MIL/uL (ref 3.80–5.10)
RDW: 13.4 % (ref 11.0–15.0)
WBC: 4.2 10*3/uL (ref 3.8–10.8)

## 2016-06-04 LAB — COMPREHENSIVE METABOLIC PANEL
ALT: 15 U/L (ref 6–29)
AST: 17 U/L (ref 10–30)
Albumin: 4.3 g/dL (ref 3.6–5.1)
Alkaline Phosphatase: 53 U/L (ref 33–115)
BUN: 8 mg/dL (ref 7–25)
CHLORIDE: 104 mmol/L (ref 98–110)
CO2: 27 mmol/L (ref 20–31)
CREATININE: 0.84 mg/dL (ref 0.50–1.10)
Calcium: 9.1 mg/dL (ref 8.6–10.2)
GLUCOSE: 77 mg/dL (ref 70–99)
POTASSIUM: 3.8 mmol/L (ref 3.5–5.3)
Sodium: 139 mmol/L (ref 135–146)
Total Bilirubin: 0.4 mg/dL (ref 0.2–1.2)
Total Protein: 7 g/dL (ref 6.1–8.1)

## 2016-06-04 LAB — TSH: TSH: 0.8 m[IU]/L

## 2016-06-04 MED ORDER — CHOLECALCIFEROL 25 MCG (1000 UT) PO TABS: 1000.0000 [IU] | ORAL_TABLET | Freq: Every day | ORAL | 0 refills | Status: DC

## 2016-06-04 NOTE — Progress Notes (Signed)
   Subjective:    Patient ID: Jennifer Hansen, female    DOB: December 03, 1975, 40 y.o.   MRN: [id number]  Patient presents for CPE with PAP (is fasting) Patient here for complete physical exam as well as fasting labs.  Last Pap smear 2014 which was normal. She is due for tetanus booster No concerns today Initial cycle is regular her menses started this morning Review Of Systems:  GEN- denies fatigue, fever, weight loss,weakness, recent illness HEENT- denies eye drainage, change in vision, nasal discharge, CVS- denies chest pain, palpitations RESP- denies SOB, cough, wheeze ABD- denies N/V, change in stools, abd pain GU- denies dysuria, hematuria, dribbling, incontinence MSK- denies joint pain, muscle aches, injury Neuro- denies headache, dizziness, syncope, seizure activity       Objective:    BP 120/72 (BP Location: Left Arm, Patient Position: Sitting, Cuff Size: Normal)   Pulse 76   Temp 98.9 F (37.2 C) (Oral)   Resp 14   Ht 5\' 4"  (1.626 m)   Wt 157 lb (71.2 kg)   LMP 06/04/2016 Comment: regular  SpO2 98%   BMI 26.95 kg/m  GEN- NAD, alert and oriented x3 HEENT- PERRL, EOMI, non injected sclera, pink conjunctiva, MMM, oropharynx clear Neck- Supple, no thyromegaly Breast- normal symmetry, no nipple inversion,no nipple drainage, no nodules or lumps felt Nodes- no axillary nodes CVS- RRR, no murmur RESP-CTAB ABD-NABS,soft,NT,ND GU- normal external genitalia, vaginal mucosa pink and moist, cervix visualized no growth,+ blood form os, minimal thin clear discharge, no CMT, no ovarian masses, uterus normal size EXT- No edema Pulses- Radial, DP- 2+        Assessment & Plan:      Problem List Items Addressed This Visit    None    Visit Diagnoses    Routine general medical examination at a health care facility    -  Primary   CPE done, fasting labs, HIV screening, PAP Smear, Pt to schedule Mammogram   Relevant Orders   CBC with Differential/Platelet   Comprehensive  metabolic panel   Lipid panel   TSH   HIV antibody (with reflex)   Cervical cancer screening       Relevant Orders   PAP, ThinPrep ASCUS Rflx HPV Rflx Type   Encounter for vitamin deficiency screening       Relevant Orders   Vitamin D, 25-hydroxy   Breast cancer screening       Relevant Orders   MM DIGITAL SCREENING BILATERAL   Need for prophylactic vaccination with combined diphtheria-tetanus-pertussis (DTP) vaccine          Note: This dictation was prepared with Dragon dictation along with smaller phrase technology. Any transcriptional errors that result from this process are unintentional.

## 2016-06-04 NOTE — Patient Instructions (Signed)
I recommend eye visit once a year I recommend dental visit every 6 months Goal is to  Exercise 30 minutes 5 days a week We will send a letter with lab results  Schedule your mammogram F/U 1 year or as needed

## 2016-06-05 LAB — HIV ANTIBODY (ROUTINE TESTING W REFLEX): HIV 1&2 Ab, 4th Generation: NONREACTIVE

## 2016-06-05 LAB — VITAMIN D 25 HYDROXY (VIT D DEFICIENCY, FRACTURES): Vit D, 25-Hydroxy: 33 ng/mL (ref 30–100)

## 2016-06-06 LAB — PAP THINPREP ASCUS RFLX HPV RFLX TYPE

## 2016-06-21 ENCOUNTER — Ambulatory Visit
Admission: RE | Admit: 2016-06-21 | Discharge: 2016-06-21 | Disposition: A | Discharge status: Home / Self Care | Payer: 59 | Source: Ambulatory Visit | Attending: Family Medicine | Admitting: Family Medicine

## 2016-06-21 DIAGNOSIS — Z1239 Encounter for other screening for malignant neoplasm of breast: Secondary | ICD-10-CM

## 2016-06-21 DIAGNOSIS — Z1231 Encounter for screening mammogram for malignant neoplasm of breast: Secondary | ICD-10-CM | POA: Diagnosis not present

## 2016-06-27 ENCOUNTER — Encounter: Payer: Self-pay | Admitting: Family Medicine

## 2016-06-27 ENCOUNTER — Other Ambulatory Visit: Payer: Self-pay | Admitting: Family Medicine

## 2016-06-27 DIAGNOSIS — R928 Other abnormal and inconclusive findings on diagnostic imaging of breast: Secondary | ICD-10-CM

## 2016-07-04 ENCOUNTER — Ambulatory Visit
Admission: RE | Admit: 2016-07-04 | Discharge: 2016-07-04 | Disposition: A | Discharge status: Home / Self Care | Payer: 59 | Source: Ambulatory Visit | Attending: Family Medicine | Admitting: Family Medicine

## 2016-07-04 DIAGNOSIS — R928 Other abnormal and inconclusive findings on diagnostic imaging of breast: Secondary | ICD-10-CM

## 2016-07-04 DIAGNOSIS — R922 Inconclusive mammogram: Secondary | ICD-10-CM | POA: Diagnosis not present

## 2017-02-15 ENCOUNTER — Encounter: Payer: Self-pay | Admitting: Family Medicine

## 2017-02-15 ENCOUNTER — Ambulatory Visit
Admission: RE | Admit: 2017-02-15 | Discharge: 2017-02-15 | Disposition: A | Discharge status: Home / Self Care | Payer: 59 | Source: Ambulatory Visit | Attending: Family Medicine | Admitting: Family Medicine

## 2017-02-15 ENCOUNTER — Ambulatory Visit (INDEPENDENT_AMBULATORY_CARE_PROVIDER_SITE_OTHER): Payer: 59 | Admitting: Family Medicine

## 2017-02-15 VITALS — BP 108/78 | HR 78 | Temp 99.0°F | Resp 16 | Ht 64.0 in | Wt 159.6 lb

## 2017-02-15 DIAGNOSIS — R053 Chronic cough: Secondary | ICD-10-CM

## 2017-02-15 DIAGNOSIS — R05 Cough: Secondary | ICD-10-CM

## 2017-02-15 NOTE — Progress Notes (Signed)
   Subjective:    Patient ID: Jennifer Hansen, female    DOB: 1975-08-29, 41 y.o.   MRN: [id number]  Patient presents for Cough (on and off especially at night. Has been more than a month)   Cough for > 1 month at night when she lays down.  NO SOB, no wheezing, no CP.Her husband states that she coughs throughout the night is always a dry cough. He has not noted any apnea symptoms. No illness, non smoker   Denies sinus drainage. She does occasionally get a bitter taste in the back of her throat does not have burning sensation in her chest or pain after eating.    Review Of Systems:  GEN- denies fatigue, fever, weight loss,weakness, recent illness HEENT- denies eye drainage, change in vision, nasal discharge, CVS- denies chest pain, palpitations RESP- denies SOB, cough, wheeze ABD- denies N/V, change in stools, abd pain GU- denies dysuria, hematuria, dribbling, incontinence MSK- denies joint pain, muscle aches, injury Neuro- denies headache, dizziness, syncope, seizure activity       Objective:    BP 108/78   Pulse 78   Temp 99 F (37.2 C) (Oral)   Resp 16   Ht 5\' 4"  (1.626 m)   Wt 159 lb 9.6 oz (72.4 kg)   SpO2 98%   BMI 27.40 kg/m  GEN- NAD, alert and oriented x3 HEENT- PERRL, EOMI, non injected sclera, pink conjunctiva, MMM, oropharynx clear Neck- Supple, no thyromegaly CVS- RRR, no murmur RESP-CTAB ABD-NABS,soft,NT,ND EXT- No edema Pulses- Radial  2+        Assessment & Plan:      Problem List Items Addressed This Visit    None    Visit Diagnoses    Chronic cough    -  Primary   Chronic Cough, DD post nasal drip, undiagnosed lung disease (less likely)GERD based on history. Non smoker but with chronic cough obtain CXR. No sign of sinus drainage I think this is most likley GERD related, so will give samples of dexilant to try as well. SHE WILL CALL in 2 weeks to update me   Relevant Orders   DG Chest 2 View      Note: This dictation was prepared with Dragon  dictation along with smaller phrase technology. Any transcriptional errors that result from this process are unintentional.

## 2017-02-15 NOTE — Patient Instructions (Addendum)
Try the dexilant for reflux Get the xray  [facility]  [address] F/U pending results

## 2017-02-18 ENCOUNTER — Ambulatory Visit: Payer: 59 | Admitting: Family Medicine

## 2017-03-05 ENCOUNTER — Encounter: Payer: Self-pay | Admitting: Family Medicine

## 2017-03-05 ENCOUNTER — Telehealth: Payer: Self-pay

## 2017-03-05 MED ORDER — DEXLANSOPRAZOLE 60 MG PO CPDR: 60.0000 mg | DELAYED_RELEASE_CAPSULE | Freq: Every day | ORAL | 0 refills | Status: DC

## 2017-03-05 NOTE — Telephone Encounter (Signed)
Rx has been sent to pharmacy patient aware

## 2017-03-08 ENCOUNTER — Telehealth: Payer: Self-pay

## 2017-03-08 MED ORDER — OMEPRAZOLE 40 MG PO CPDR: 40.0000 mg | DELAYED_RELEASE_CAPSULE | Freq: Every day | ORAL | 3 refills | Status: DC

## 2017-03-08 MED FILL — OMEPRAZOLE DR 40 MG CAPSULE: 40 | 30 days supply | Qty: 30 | Fill #0

## 2017-03-08 NOTE — Telephone Encounter (Signed)
rx sent to pharmacy pt aware 

## 2017-04-08 ENCOUNTER — Encounter: Payer: Self-pay | Admitting: Family Medicine

## 2017-05-07 ENCOUNTER — Ambulatory Visit (HOSPITAL_COMMUNITY)
Admission: RE | Admit: 2017-05-07 | Discharge: 2017-05-07 | Disposition: A | Discharge status: Home / Self Care | Payer: 59 | Source: Ambulatory Visit | Attending: Family Medicine | Admitting: Family Medicine

## 2017-05-07 ENCOUNTER — Encounter: Payer: Self-pay | Admitting: Family Medicine

## 2017-05-07 ENCOUNTER — Ambulatory Visit (INDEPENDENT_AMBULATORY_CARE_PROVIDER_SITE_OTHER): Payer: 59 | Admitting: Family Medicine

## 2017-05-07 VITALS — BP 106/64 | HR 82 | Temp 98.4°F | Resp 14 | Ht 64.0 in | Wt 164.0 lb

## 2017-05-07 DIAGNOSIS — M25461 Effusion, right knee: Secondary | ICD-10-CM | POA: Insufficient documentation

## 2017-05-07 DIAGNOSIS — M25561 Pain in right knee: Secondary | ICD-10-CM

## 2017-05-07 NOTE — Patient Instructions (Signed)
F/U pending results   

## 2017-05-07 NOTE — Progress Notes (Signed)
   Subjective:    Patient ID: Jennifer Hansen, female    DOB: August 19, 1975, 41 y.o.   MRN: [id number]  Patient presents for Edema (x1 week- R knee has been swelling and painful- swelling does not go away- denies injury to knee) Patient here with swelling and pain to her right knee that occurred spontaneously a week ago.  No specific injury no previous injuries never had any joint swellings before.  She has noticed a popping in her knee as well.  She does have family history of osteoarthritis but no autoimmune that she is aware of   She did take a couple doses of Tylenol and ibuprofen which is helped with some of the swelling.   Review Of Systems:  GEN- denies fatigue, fever, weight loss,weakness, recent illness HEENT- denies eye drainage, change in vision, nasal discharge, CVS- denies chest pain, palpitations RESP- denies SOB, cough, wheeze ABD- denies N/V, change in stools, abd pain GU- denies dysuria, hematuria, dribbling, incontinence MSK- + joint pain, muscle aches, injury Neuro- denies headache, dizziness, syncope, seizure activity       Objective:    BP 106/64   Pulse 82   Temp 98.4 F (36.9 C) (Oral)   Resp 14   Ht 5\' 4"  (1.626 m)   Wt 164 lb (74.4 kg)   SpO2 98%   BMI 28.15 kg/m  GEN- NAD, alert and oriented x3 MSK- Right knee- + effusion palpated more inferiorly, good rom,+ CREPITUS, ligaments in tact, no warmth  , non antalgic gait  Left knee- normal inspection FROM, bilat ankle FROM, no effusion        Assessment & Plan:      Problem List Items Addressed This Visit    None    Visit Diagnoses    Acute pain of right knee    -  Primary   Unknown cause of acute effusion and pain. Check Uric acid, CBC, RF. With her young age concerning. XRAY of knee, ortho pendings work up, ibuprofen with food,ICE   Relevant Orders   DG Knee Complete 4 Views Right   CBC with Differential/Platelet   Uric Acid   C-reactive protein   Rheumatoid factor   Effusion of right knee        Relevant Orders   DG Knee Complete 4 Views Right   CBC with Differential/Platelet   Uric Acid   C-reactive protein   Rheumatoid factor      Note: This dictation was prepared with Dragon dictation along with smaller phrase technology. Any transcriptional errors that result from this process are unintentional.

## 2017-05-08 ENCOUNTER — Other Ambulatory Visit: Payer: Self-pay | Admitting: *Deleted

## 2017-05-08 LAB — CBC WITH DIFFERENTIAL/PLATELET
BASOS ABS: 42 {cells}/uL (ref 0–200)
Basophils Relative: 0.8 %
Eosinophils Absolute: 212 cells/uL (ref 15–500)
Eosinophils Relative: 4 %
HCT: 37 % (ref 35.0–45.0)
Hemoglobin: 12.9 g/dL (ref 11.7–15.5)
Lymphs Abs: 2634 cells/uL (ref 850–3900)
MCH: 32.1 pg (ref 27.0–33.0)
MCHC: 34.9 g/dL (ref 32.0–36.0)
MCV: 92 fL (ref 80.0–100.0)
MPV: 10.5 fL (ref 7.5–12.5)
Monocytes Relative: 13.4 %
NEUTROS PCT: 32.1 %
Neutro Abs: 1701 cells/uL (ref 1500–7800)
PLATELETS: 303 10*3/uL (ref 140–400)
RBC: 4.02 10*6/uL (ref 3.80–5.10)
RDW: 12.4 % (ref 11.0–15.0)
TOTAL LYMPHOCYTE: 49.7 %
WBC mixed population: 710 cells/uL (ref 200–950)
WBC: 5.3 10*3/uL (ref 3.8–10.8)

## 2017-05-08 LAB — RHEUMATOID FACTOR: Rhuematoid fact SerPl-aCnc: <14 IU/mL (ref <14)

## 2017-05-08 LAB — C-REACTIVE PROTEIN: CRP: 2.3 mg/L (ref <8.0)

## 2017-05-08 LAB — URIC ACID: Uric Acid, Serum: 4.6 mg/dL (ref 2.5–7.0)

## 2017-05-08 MED ORDER — IBUPROFEN 600 MG PO TABS: 600.0000 mg | ORAL_TABLET | Freq: Two times a day (BID) | ORAL | 0 refills | Status: DC | PRN

## 2017-05-08 MED FILL — IBUPROFEN 600 MG TABLET: 600 | 30 days supply | Qty: 60 | Fill #0

## 2017-05-29 ENCOUNTER — Encounter: Payer: Self-pay | Admitting: Family Medicine

## 2017-06-10 ENCOUNTER — Encounter: Payer: 59 | Admitting: Family Medicine

## 2017-06-11 ENCOUNTER — Other Ambulatory Visit: Payer: Self-pay | Admitting: Family Medicine

## 2017-06-11 DIAGNOSIS — Z1231 Encounter for screening mammogram for malignant neoplasm of breast: Secondary | ICD-10-CM

## 2017-06-20 ENCOUNTER — Other Ambulatory Visit: Payer: Self-pay

## 2017-06-20 ENCOUNTER — Ambulatory Visit (INDEPENDENT_AMBULATORY_CARE_PROVIDER_SITE_OTHER): Payer: 59 | Admitting: Family Medicine

## 2017-06-20 ENCOUNTER — Encounter: Payer: Self-pay | Admitting: Family Medicine

## 2017-06-20 VITALS — BP 102/64 | HR 62 | Temp 98.1°F | Resp 16 | Ht 64.0 in | Wt 163.0 lb

## 2017-06-20 DIAGNOSIS — Z0000 Encounter for general adult medical examination without abnormal findings: Secondary | ICD-10-CM

## 2017-06-20 DIAGNOSIS — E559 Vitamin D deficiency, unspecified: Secondary | ICD-10-CM

## 2017-06-20 DIAGNOSIS — L309 Dermatitis, unspecified: Secondary | ICD-10-CM | POA: Diagnosis not present

## 2017-06-20 LAB — TSH: TSH: 0.88 mIU/L

## 2017-06-20 NOTE — Progress Notes (Signed)
   Subjective:    Patient ID: Jennifer Hansen, female    DOB: 01/27/1976, 41 y.o.   MRN: [id number]  Patient presents for CPE (is fasting) and Itching (has itchy area on R outer ankle- no skin irritation noted)  Pt here for CPE  Medications reviewed Immunizations- UTD PAP Smear UTD normal 2017 Mammogram - 3D scheduled for Jan   She has itchy spot on right lateral ankle that comes and goes, when she gets sweaty it gets red and itchy. Has seen dermatology in past, nothing specific found   LMP- currently on menses, regular Past few months 2 days before she gets some discharge, has had some itching will use monistat    Review Of Systems:  GEN- denies fatigue, fever, weight loss,weakness, recent illness HEENT- denies eye drainage, change in vision, nasal discharge, CVS- denies chest pain, palpitations RESP- denies SOB, cough, wheeze ABD- denies N/V, change in stools, abd pain GU- denies dysuria, hematuria, dribbling, incontinence MSK- denies joint pain, muscle aches, injury Neuro- denies headache, dizziness, syncope, seizure activity       Objective:    BP 102/64   Pulse 62   Temp 98.1 F (36.7 C) (Oral)   Resp 16   Ht 5\' 4"  (1.626 m)   Wt 163 lb (73.9 kg)   LMP 06/18/2017   SpO2 98%   BMI 27.98 kg/m  GEN- NAD, alert and oriented x3 HEENT- PERRL, EOMI, non injected sclera, pink conjunctiva, MMM, oropharynx clear, TM clear bilat no effusion  Neck- Supple, no thyromegaly CVS- RRR, no murmur RESP-CTAB ABD-NABS,soft,NT,ND Skin- mild erythema on right lateral ankle, few excoriations  EXT- No edema Pulses- Radial, DP- 2+        Assessment & Plan:      Problem List Items Addressed This Visit    None    Visit Diagnoses    Routine general medical examination at a health care facility    -  Primary   CPE done, mammogram scheduled, immunizationd UTD, PAP UTD, advised increased discharge is normal but should not itch, advised to increase yogurt, probiotics, discussed  soaps to use.  If this worsens of she gets discharge that is symptomatic after cycle come in for swabs. None currently   Relevant Orders   CBC with Differential/Platelet   Comprehensive metabolic panel   Lipid panel   TSH   Eczema, unspecified type       Mild eczema, moisture, if intense itching cortisne 1% OTC    Vitamin D deficiency       Relevant Orders   Vitamin D, 25-hydroxy      Note: This dictation was prepared with Dragon dictation along with smaller phrase technology. Any transcriptional errors that result from this process are unintentional.

## 2017-06-20 NOTE — Patient Instructions (Addendum)
Use cortisone 1%  F/U 1 year for Physical

## 2017-06-21 LAB — CBC WITH DIFFERENTIAL/PLATELET
BASOS PCT: 0.8 %
Basophils Absolute: 31 cells/uL (ref 0–200)
EOS PCT: 2.1 %
Eosinophils Absolute: 82 cells/uL (ref 15–500)
HEMATOCRIT: 36.8 % (ref 35.0–45.0)
Hemoglobin: 12.5 g/dL (ref 11.7–15.5)
LYMPHS ABS: 2106 {cells}/uL (ref 850–3900)
MCH: 31.4 pg (ref 27.0–33.0)
MCHC: 34 g/dL (ref 32.0–36.0)
MCV: 92.5 fL (ref 80.0–100.0)
MPV: 10.6 fL (ref 7.5–12.5)
Monocytes Relative: 12.4 %
Neutro Abs: 1197 cells/uL — ABNORMAL LOW (ref 1500–7800)
Neutrophils Relative %: 30.7 %
Platelets: 265 10*3/uL (ref 140–400)
RBC: 3.98 10*6/uL (ref 3.80–5.10)
RDW: 12.1 % (ref 11.0–15.0)
Total Lymphocyte: 54 %
WBC: 3.9 10*3/uL (ref 3.8–10.8)
WBCMIX: 484 {cells}/uL (ref 200–950)

## 2017-06-21 LAB — LIPID PANEL
CHOL/HDL RATIO: 3.2 (calc) (ref <5.0)
Cholesterol: 188 mg/dL (ref <200)
HDL: 58 mg/dL (ref >50)
LDL CHOLESTEROL (CALC): 110 mg/dL — AB
Non-HDL Cholesterol (Calc): 130 mg/dL (calc) — ABNORMAL HIGH (ref <130)
Triglycerides: 92 mg/dL (ref <150)

## 2017-06-21 LAB — COMPREHENSIVE METABOLIC PANEL
AG Ratio: 1.7 (calc) (ref 1.0–2.5)
ALBUMIN MSPROF: 4.3 g/dL (ref 3.6–5.1)
ALKALINE PHOSPHATASE (APISO): 55 U/L (ref 33–115)
ALT: 13 U/L (ref 6–29)
AST: 14 U/L (ref 10–30)
BILIRUBIN TOTAL: 0.3 mg/dL (ref 0.2–1.2)
BUN: 10 mg/dL (ref 7–25)
CO2: 26 mmol/L (ref 20–32)
CREATININE: 0.88 mg/dL (ref 0.50–1.10)
Calcium: 9.7 mg/dL (ref 8.6–10.2)
Chloride: 104 mmol/L (ref 98–110)
GLOBULIN: 2.5 g/dL (ref 1.9–3.7)
Glucose, Bld: 83 mg/dL (ref 65–99)
Potassium: 4.7 mmol/L (ref 3.5–5.3)
SODIUM: 139 mmol/L (ref 135–146)
TOTAL PROTEIN: 6.8 g/dL (ref 6.1–8.1)

## 2017-06-21 LAB — VITAMIN D 25 HYDROXY (VIT D DEFICIENCY, FRACTURES): VIT D 25 HYDROXY: 26 ng/mL — AB (ref 30–100)

## 2017-06-24 ENCOUNTER — Encounter: Payer: Self-pay | Admitting: *Deleted

## 2017-07-02 ENCOUNTER — Encounter: Payer: Self-pay | Admitting: Family Medicine

## 2017-07-10 ENCOUNTER — Ambulatory Visit
Admission: RE | Admit: 2017-07-10 | Discharge: 2017-07-10 | Disposition: A | Discharge status: Home / Self Care | Payer: 59 | Source: Ambulatory Visit | Attending: Family Medicine | Admitting: Family Medicine

## 2017-07-10 DIAGNOSIS — Z1231 Encounter for screening mammogram for malignant neoplasm of breast: Secondary | ICD-10-CM | POA: Diagnosis not present

## 2017-12-20 ENCOUNTER — Telehealth: Payer: Self-pay | Admitting: Family Medicine

## 2017-12-20 MED ORDER — ALPRAZOLAM 0.5 MG PO TABS: 0.5000 mg | ORAL_TABLET | Freq: Three times a day (TID) | ORAL | 1 refills | Status: DC | PRN

## 2017-12-20 MED FILL — ALPRAZolam 0.5 MG TABS: 0.5 | 15 days supply | Qty: 45 | Fill #0

## 2017-12-20 NOTE — Telephone Encounter (Signed)
Spoke with patient with regards to her husband Gwyndolyn Saxon passed passing away in the middle the night.  She has a lot of family and friends that have been with her today.  Her children are also there.  She has not been able to sleep and feels like she cannot calm down at all.  I meant to send over some Xanax for her to use as needed for sleep and her anxiety through the upcoming funeral arrangements.  She will call me if she has any concerns.

## 2018-02-24 ENCOUNTER — Telehealth: Payer: 59 | Admitting: Family

## 2018-02-24 DIAGNOSIS — R05 Cough: Secondary | ICD-10-CM

## 2018-02-24 DIAGNOSIS — K2960 Other gastritis without bleeding: Secondary | ICD-10-CM | POA: Diagnosis not present

## 2018-02-24 DIAGNOSIS — R059 Cough, unspecified: Secondary | ICD-10-CM

## 2018-02-24 NOTE — Progress Notes (Signed)
We are sorry that you are not feeling well.  Here is how we plan to help!  Based on your presentation I believe you most likely have A cough due to reflux. To lessen this cough you can use the over-the counter cough medication Delsym but it is very important that we control the cause of the cough.  I suggest that you begin Prilosec 20 mg twice a day for 2 weeks.  You should see the cough lessen quickly as your reflux ( which may be silent or without burning ) is treated.    These medications are over the counter.   From your responses in the eVisit questionnaire you describe inflammation in the upper respiratory tract which is causing a significant cough.  This is commonly called Bronchitis and has four common causes:    Allergies  Viral Infections  Acid Reflux  Bacterial Infection Allergies, viruses and acid reflux are treated by controlling symptoms or eliminating the cause. An example might be a cough caused by taking certain blood pressure medications. You stop the cough by changing the medication. Another example might be a cough caused by acid reflux. Controlling the reflux helps control the cough.  USE OF BRONCHODILATOR ("RESCUE") INHALERS: There is a risk from using your bronchodilator too frequently.  The risk is that over-reliance on a medication which only relaxes the muscles surrounding the breathing tubes can reduce the effectiveness of medications prescribed to reduce swelling and congestion of the tubes themselves.  Although you feel brief relief from the bronchodilator inhaler, your asthma may actually be worsening with the tubes becoming more swollen and filled with mucus.  This can delay other crucial treatments, such as oral steroid medications. If you need to use a bronchodilator inhaler daily, several times per day, you should discuss this with your provider.  There are probably better treatments that could be used to keep your asthma under control.     HOME CARE . Only take  medications as instructed by your medical team. . Complete the entire course of an antibiotic. . Drink plenty of fluids and get plenty of rest. . Avoid close contacts especially the very young and the elderly . Cover your mouth if you cough or cough into your sleeve. . Always remember to wash your hands . A steam or ultrasonic humidifier can help congestion.   GET HELP RIGHT AWAY IF: . You develop worsening fever. . You become short of breath . You cough up blood. . Your symptoms persist after you have completed your treatment plan MAKE SURE YOU   Understand these instructions.  Will watch your condition.  Will get help right away if you are not doing well or get worse.  Your e-visit answers were reviewed by a board certified advanced clinical practitioner to complete your personal care plan.  Depending on the condition, your plan could have included both over the counter or prescription medications. If there is a problem please reply  once you have received a response from your provider. Your safety is important to [place].  If you have drug allergies check your prescription carefully.    You can use MyChart to ask questions about today's visit, request a non-urgent call back, or ask for a work or school excuse for 24 hours related to this e-Visit. If it has been greater than 24 hours you will need to follow up with your provider, or enter a new e-Visit to address those concerns. You will get an e-mail in the next  to address those concerns.  You will get an e-mail in the next two days asking about your experience.  I hope that your e-visit has been valuable and will speed your recovery. Thank you for using e-visits.   

## 2018-02-25 MED FILL — OMEPRAZOLE 40 MG CPDR: 40 | 30 days supply | Qty: 30 | Fill #0

## 2018-02-25 MED FILL — OMEPRAZOLE 40 MG CPDR: 40 | 30 days supply | Qty: 30 | Fill #1 | Status: TO

## 2018-02-27 ENCOUNTER — Ambulatory Visit (INDEPENDENT_AMBULATORY_CARE_PROVIDER_SITE_OTHER): Payer: 59 | Admitting: Physician Assistant

## 2018-02-27 ENCOUNTER — Encounter: Payer: Self-pay | Admitting: Physician Assistant

## 2018-02-27 VITALS — BP 100/76 | HR 96 | Temp 98.7°F | Resp 16 | Ht 64.0 in | Wt 151.0 lb

## 2018-02-27 DIAGNOSIS — B9689 Other specified bacterial agents as the cause of diseases classified elsewhere: Secondary | ICD-10-CM

## 2018-02-27 DIAGNOSIS — J988 Other specified respiratory disorders: Secondary | ICD-10-CM | POA: Diagnosis not present

## 2018-02-27 MED ORDER — AZITHROMYCIN 250 MG PO TABS: ORAL_TABLET | ORAL | 0 refills | Status: DC

## 2018-02-27 MED FILL — AZITHROMYCIN 250 MG TABS: 250 | 5 days supply | Qty: 6 | Fill #0

## 2018-02-27 NOTE — Progress Notes (Signed)
Patient ID: Jennifer Hansen MRN: [id number], DOB: [birth year], 42 y.o. Date of Encounter: 02/27/2018, 10:21 AM    Chief Complaint:  Chief Complaint  Patient presents with  . Cough    dry cough symptoms for 1 week      HPI: 42 y.o. year old female presetns with above.   I reviewed her chart.   Saw that she had a visit with Dr. Buelah Manis on 02/15/2017 regarding cough for > 1 month.  At that visit cough was felt to be secondary to GERD.  Prescribed PPI.  Also obtained chest x-ray. Chest x-ray 01/2017 was negative.  Today patient reports that at that visit in the past she was told she had reflux so she took Prilosec and the cough and symptoms resolved so she subsequently stopped taking the Prilosec.   She continued to do well off of medication with no cough or symptoms.  Just recently the symptoms started to recur and she was having some cough.   She then remembered that she was having cough when she saw Dr. Buelah Manis last year and prescribed Prilosec so she recently started back the Prilosec to see if that would help.   However the symptoms are persisting and she is continuing with cough.  Reports that 2 weeks ago she was having symptoms of a cold.  Was having runny nose.  States that now she is continuing to have cough throughout the day. States that when she saw Dr. Buelah Manis a year ago she was only having cough at night.  States that now she is having cough all day.  She does not smoke.  She has had no fevers or chills.  No shortness of breath.  No other concerns to address today.    Home Meds:   Outpatient Medications Prior to Visit  Medication Sig Dispense Refill  . ALPRAZolam (XANAX) 0.5 MG tablet Take 1 tablet (0.5 mg total) by mouth 3 (three) times daily as needed for anxiety. 45 tablet 1  . Cholecalciferol (HM VITAMIN D) 1000 units tablet Take 1 tablet (1,000 Units total) by mouth daily. 30 tablet 0  . Multiple Vitamin (MULTIVITAMIN WITH MINERALS) TABS tablet Take 1 tablet by mouth  daily.    Marland Kitchen omeprazole (PRILOSEC) 40 MG capsule Take 1 capsule (40 mg total) by mouth daily. (Patient taking differently: Take 40 mg by mouth daily as needed. ) 30 capsule 3   No facility-administered medications prior to visit.     Allergies: No Known Allergies    Review of Systems: See HPI for pertinent ROS. All other ROS negative.    Physical Exam: Blood pressure 100/76, pulse 96, temperature 98.7 F (37.1 C), temperature source Oral, resp. rate 16, height 5\' 4"  (1.626 m), weight 68.5 kg, last menstrual period 01/20/2018, SpO2 100 %., Body mass index is 25.92 kg/m. General: WNWD AAF.  Appears in no acute distress. HEENT: Normocephalic, atraumatic, eyes without discharge, sclera non-icteric, nares are without discharge. Bilateral auditory canals clear, TM's are without perforation, pearly grey and translucent with reflective cone of light bilaterally. Oral cavity moist, posterior pharynx without exudate, erythema, peritonsillar abscess.  Neck: Supple. No thyromegaly. No lymphadenopathy. Lungs: Clear bilaterally to auscultation without wheezes, rales, or rhonchi. Breathing is unlabored. Heart: Regular rhythm. No murmurs, rubs, or gallops. Msk:  Strength and tone normal for age. Extremities/Skin: Warm and dry.  Neuro: Alert and oriented X 3. Moves all extremities spontaneously. Gait is normal. CNII-XII grossly in tact. Psych:  Responds to questions appropriately with a  normal affect.     ASSESSMENT AND PLAN:  42 y.o. year old female with   1. Bacterial respiratory infection She is to take antibiotic as directed.  Follow-up if symptoms do not resolve within 1 week after completion of antibiotic. - azithromycin (ZITHROMAX) 250 MG tablet; Day 1: Take 2 daily. Days 2 -5: Take 1 daily.  Dispense: 6 tablet; Refill: 0   Signed, [address] [place], [place], [facility] 02/27/2018 10:21 AM

## 2018-02-28 ENCOUNTER — Encounter: Payer: Self-pay | Admitting: Physician Assistant

## 2018-06-23 ENCOUNTER — Encounter: Payer: Self-pay | Admitting: Family Medicine

## 2018-06-23 ENCOUNTER — Ambulatory Visit (INDEPENDENT_AMBULATORY_CARE_PROVIDER_SITE_OTHER): Payer: 59 | Admitting: Family Medicine

## 2018-06-23 ENCOUNTER — Other Ambulatory Visit: Payer: Self-pay

## 2018-06-23 VITALS — BP 132/74 | HR 78 | Temp 98.2°F | Resp 16 | Ht 64.0 in | Wt 149.0 lb

## 2018-06-23 DIAGNOSIS — Z1239 Encounter for other screening for malignant neoplasm of breast: Secondary | ICD-10-CM | POA: Diagnosis not present

## 2018-06-23 DIAGNOSIS — E559 Vitamin D deficiency, unspecified: Secondary | ICD-10-CM

## 2018-06-23 DIAGNOSIS — L309 Dermatitis, unspecified: Secondary | ICD-10-CM | POA: Diagnosis not present

## 2018-06-23 DIAGNOSIS — Z0000 Encounter for general adult medical examination without abnormal findings: Secondary | ICD-10-CM

## 2018-06-23 DIAGNOSIS — R0981 Nasal congestion: Secondary | ICD-10-CM

## 2018-06-23 MED ORDER — TRIAMCINOLONE ACETONIDE 0.1 % EX CREA: 1.0000 "application " | TOPICAL_CREAM | Freq: Two times a day (BID) | CUTANEOUS | 1 refills | Status: DC

## 2018-06-23 MED FILL — TRIAMCINOLONE 0.1% CREAM: 0.1 | 15 days supply | Qty: 30 | Fill #0

## 2018-06-23 NOTE — Progress Notes (Signed)
   Subjective:    Patient ID: Jennifer Hansen, female    DOB: 1975/08/25, 42 y.o.   MRN: [id number]  Patient presents for Annual Exam (is fasting)   Pt here for CPE  Medications and history reviewed Mammogram done in Jan 2019 normal   Menses regular   PAP SMEAR- UTD  Immunizations UTD Vitamin D def- currently on 2000IU daily  Indigestion- takes prilosec as needed   Her husband passed away suddenty  in 01-01-18   Her recurrent dermatitis to right ankle for past year or so , has seen dermatology, nothing specific found, no longer has steroid cream, gets itching, mild redness, it has not spread   Nasal congestion, sneezing, itchy eyes, started yesterday, no cough, no congestion, no fever   Used zyrtec OTC   Review Of Systems:  GEN- denies fatigue, fever, weight loss,weakness, recent illness HEENT- denies eye drainage, change in vision,+ nasal discharge, CVS- denies chest pain, palpitations RESP- denies SOB, cough, wheeze ABD- denies N/V, change in stools, abd pain GU- denies dysuria, hematuria, dribbling, incontinence MSK- denies joint pain, muscle aches, injury Neuro- denies headache, dizziness, syncope, seizure activity       Objective:    BP 132/74   Pulse 78   Temp 98.2 F (36.8 C) (Oral)   Resp 16   Ht 5\' 4"  (1.626 m)   Wt 149 lb (67.6 kg)   LMP 06/07/2018 Comment: regular  SpO2 99%   BMI 25.58 kg/m  GEN- NAD, alert and oriented x3 HEENT- PERRL, EOMI, non injected sclera, pink conjunctiva, MMM, oropharynx clear, nares clear rhinorrhea, TM clear bilat, no effusion, no maxillaruy sinus tenderness Neck- Supple, no thyromegaly,no LAD CVS- RRR, no murmur RESP-CTAB ABD-NABS,soft,NT,ND Psych- normal affect and mood Skin- mild erythema with dry skin on right ankle, NT  EXT- No edema Pulses- Radial, DP- 2+    Audit C/Depression/Fall screening NEGATIVE     Assessment & Plan:      Problem List Items Addressed This Visit    None    Visit Diagnoses    Routine  general medical examination at a health care facility    -  Primary   CPE done, fasting labs to be done, immunizations UTD, Schedule Mammogram for Jan, she has support from family with regards to husband   Relevant Orders   CBC with Differential/Platelet   Comprehensive metabolic panel   Lipid panel   Vitamin D deficiency       Relevant Orders   Vitamin D, 25-hydroxy   Dermatitis       Triamcinolone cream with moisturizer for flares    Nasal congestion       possible early URI symptoms, can use nasal steroid, vitamin C/ZINC, elderberry as natural cough med      Note: This dictation was prepared with Dragon dictation along with smaller phrase technology. Any transcriptional errors that result from this process are unintentional.

## 2018-06-23 NOTE — Patient Instructions (Addendum)
F/u 1 year for Physical I recommend eye visit every 2 year I recommend dental visit every 6 months Goal is to  Exercise 30 minutes 5 days a week We will send a letter with lab results  Vitamin C  500mg  and zinc Elderberry  Nasacort or Flonase

## 2018-06-24 ENCOUNTER — Other Ambulatory Visit: Payer: Self-pay | Admitting: Family Medicine

## 2018-06-24 DIAGNOSIS — Z1239 Encounter for other screening for malignant neoplasm of breast: Secondary | ICD-10-CM

## 2018-06-24 LAB — CBC WITH DIFFERENTIAL/PLATELET
Absolute Monocytes: 1053 cells/uL — ABNORMAL HIGH (ref 200–950)
BASOS PCT: 1.1 %
Basophils Absolute: 51 cells/uL (ref 0–200)
EOS PCT: 4.8 %
Eosinophils Absolute: 221 cells/uL (ref 15–500)
HCT: 38.7 % (ref 35.0–45.0)
Hemoglobin: 13.1 g/dL (ref 11.7–15.5)
Lymphs Abs: 1707 cells/uL (ref 850–3900)
MCH: 31.8 pg (ref 27.0–33.0)
MCHC: 33.9 g/dL (ref 32.0–36.0)
MCV: 93.9 fL (ref 80.0–100.0)
MONOS PCT: 22.9 %
MPV: 10.7 fL (ref 7.5–12.5)
NEUTROS PCT: 34.1 %
Neutro Abs: 1569 cells/uL (ref 1500–7800)
PLATELETS: 269 10*3/uL (ref 140–400)
RBC: 4.12 10*6/uL (ref 3.80–5.10)
RDW: 12.7 % (ref 11.0–15.0)
TOTAL LYMPHOCYTE: 37.1 %
WBC: 4.6 10*3/uL (ref 3.8–10.8)

## 2018-06-24 LAB — VITAMIN D 25 HYDROXY (VIT D DEFICIENCY, FRACTURES): VIT D 25 HYDROXY: 32 ng/mL (ref 30–100)

## 2018-06-24 LAB — COMPREHENSIVE METABOLIC PANEL
AG RATIO: 1.7 (calc) (ref 1.0–2.5)
ALBUMIN MSPROF: 4.3 g/dL (ref 3.6–5.1)
ALT: 13 U/L (ref 6–29)
AST: 17 U/L (ref 10–30)
Alkaline phosphatase (APISO): 54 U/L (ref 33–115)
BILIRUBIN TOTAL: 0.2 mg/dL (ref 0.2–1.2)
BUN: 11 mg/dL (ref 7–25)
CO2: 26 mmol/L (ref 20–32)
Calcium: 10 mg/dL (ref 8.6–10.2)
Chloride: 105 mmol/L (ref 98–110)
Creat: 0.91 mg/dL (ref 0.50–1.10)
GLUCOSE: 72 mg/dL (ref 65–99)
Globulin: 2.6 g/dL (calc) (ref 1.9–3.7)
POTASSIUM: 5.2 mmol/L (ref 3.5–5.3)
Sodium: 141 mmol/L (ref 135–146)
Total Protein: 6.9 g/dL (ref 6.1–8.1)

## 2018-06-24 LAB — LIPID PANEL
CHOL/HDL RATIO: 2.8 (calc) (ref <5.0)
Cholesterol: 196 mg/dL (ref <200)
HDL: 71 mg/dL (ref >50)
LDL CHOLESTEROL (CALC): 108 mg/dL — AB
NON-HDL CHOLESTEROL (CALC): 125 mg/dL (ref <130)
TRIGLYCERIDES: 83 mg/dL (ref <150)

## 2018-07-14 DIAGNOSIS — H5213 Myopia, bilateral: Secondary | ICD-10-CM | POA: Diagnosis not present

## 2018-07-29 ENCOUNTER — Ambulatory Visit
Admission: RE | Admit: 2018-07-29 | Discharge: 2018-07-29 | Disposition: A | Discharge status: Home / Self Care | Payer: 59 | Source: Ambulatory Visit | Attending: Family Medicine | Admitting: Family Medicine

## 2018-07-29 DIAGNOSIS — Z1231 Encounter for screening mammogram for malignant neoplasm of breast: Secondary | ICD-10-CM | POA: Diagnosis not present

## 2018-07-29 DIAGNOSIS — Z1239 Encounter for other screening for malignant neoplasm of breast: Secondary | ICD-10-CM

## 2018-09-09 ENCOUNTER — Ambulatory Visit (INDEPENDENT_AMBULATORY_CARE_PROVIDER_SITE_OTHER): Payer: 59 | Admitting: Family Medicine

## 2018-09-09 ENCOUNTER — Encounter: Payer: Self-pay | Admitting: Family Medicine

## 2018-09-09 VITALS — BP 102/70 | HR 66 | Temp 98.9°F | Resp 15 | Ht 64.0 in | Wt 146.1 lb

## 2018-09-09 DIAGNOSIS — L03116 Cellulitis of left lower limb: Secondary | ICD-10-CM

## 2018-09-09 DIAGNOSIS — L0291 Cutaneous abscess, unspecified: Secondary | ICD-10-CM | POA: Diagnosis not present

## 2018-09-09 MED ORDER — FLUCONAZOLE 150 MG PO TABS: 150.0000 mg | ORAL_TABLET | ORAL | 1 refills | Status: DC | PRN

## 2018-09-09 MED ORDER — SULFAMETHOXAZOLE-TRIMETHOPRIM 800-160 MG PO TABS: 1.0000 | ORAL_TABLET | Freq: Two times a day (BID) | ORAL | 0 refills | Status: AC

## 2018-09-09 MED FILL — SULFAMETHOXAZOLE-TMP DS TAB: 800-160 | 10 days supply | Qty: 20 | Fill #0

## 2018-09-09 MED FILL — FLUCONAZOLE 150 MG TABS: 150 | 2 days supply | Qty: 2 | Fill #0

## 2018-09-09 NOTE — Progress Notes (Signed)
Patient ID: Jennifer Hansen, female    DOB: December 27, 1975, 43 y.o.   MRN: [id number]  PCP: Alycia Rossetti, MD  Chief Complaint  Patient presents with  . Recurrent Skin Infections    Patient in today with c/o left thigh. Onset a few months ago.     Subjective:   Jennifer Hansen is a 43 y.o. female, presents to clinic with CC of skin infection/painful bump to left proximal thigh/groin area.  Abscess: Patient presents for evaluation of a cutaneous abscess. Lesion is located in the left sided inguinal region. Onset was 2 months ago. Symptoms have waxed and waned and have been mild for "a few months" but over the past week it got larger and more painful.  Abscess has associated symptoms of pain, swelling. There was no redness or drainage but it did start to improve yesterday and today, but she still wanted to get it checked.  Patient does not have previous history of cutaneous abscesses. Patient does not have diabetes.   Patient Active Problem List   Diagnosis Date Noted  . Bunion 11/18/2015     Prior to Admission medications   Medication Sig Start Date End Date Taking? Authorizing Provider  Cholecalciferol (HM VITAMIN D) 1000 units tablet Take 1 tablet (1,000 Units total) by mouth daily. 06/04/16  Yes Marion, Modena Nunnery, MD  Multiple Vitamin (MULTIVITAMIN WITH MINERALS) TABS tablet Take 1 tablet by mouth daily.   Yes [provider]  fluconazole (DIFLUCAN) 150 MG tablet Take 1 tablet (150 mg total) by mouth every 3 (three) days as needed (for vaginal itching/yeast infection sx). 09/09/18   Delsa Grana, PA-C  omeprazole (PRILOSEC) 40 MG capsule Take 1 capsule (40 mg total) by mouth daily. Patient not taking: Reported on 06/23/2018 03/08/17   Alycia Rossetti, MD  sulfamethoxazole-trimethoprim (BACTRIM DS,SEPTRA DS) 800-160 MG tablet Take 1 tablet by mouth 2 (two) times daily for 10 days. 09/09/18 09/19/18  Delsa Grana, PA-C  triamcinolone cream (KENALOG) 0.1 % Apply 1 application  topically 2 (two) times daily. Patient not taking: Reported on 09/09/2018 06/23/18   Alycia Rossetti, MD     No Known Allergies   Family History  Problem Relation Age of Onset  . Cancer Maternal Grandmother        colon cancer  . Diabetes Maternal Grandmother   . Heart disease Maternal Grandmother   . Diabetes Father   . Arthritis Father   . Aneurysm Paternal Grandfather   . Cancer Maternal Grandfather   . Arthritis Mother      Social History   Socioeconomic History  . Marital status: Widowed    Spouse name: Not on file  . Number of children: Not on file  . Years of education: Not on file  . Highest education level: Not on file  Occupational History  . Not on file  Social Needs  . Financial resource strain: Not on file  . Food insecurity:    Worry: Not on file    Inability: Not on file  . Transportation needs:    Medical: Not on file    Non-medical: Not on file  Tobacco Use  . Smoking status: Never Smoker  . Smokeless tobacco: Never Used  Substance and Sexual Activity  . Alcohol use: No  . Drug use: No  . Sexual activity: Yes    Birth control/protection: Surgical  Lifestyle  . Physical activity:    Days per week: Not on file    Minutes per  session: Not on file  . Stress: Not on file  Relationships  . Social connections:    Talks on phone: Not on file    Gets together: Not on file    Attends religious service: Not on file    Active member of club or organization: Not on file    Attends meetings of clubs or organizations: Not on file    Relationship status: Not on file  . Intimate partner violence:    Fear of current or ex partner: Not on file    Emotionally abused: Not on file    Physically abused: Not on file    Forced sexual activity: Not on file  Other Topics Concern  . Not on file  Social History Narrative  . Not on file     Review of Systems  Constitutional: Negative.   HENT: Negative.   Eyes: Negative.   Respiratory: Negative.     Cardiovascular: Negative.   Gastrointestinal: Negative.   Endocrine: Negative.   Genitourinary: Negative.   Musculoskeletal: Negative.   Skin: Negative.   Allergic/Immunologic: Negative.   Neurological: Negative.   Hematological: Negative.   Psychiatric/Behavioral: Negative.   All other systems reviewed and are negative.      Objective:    Vitals:   09/09/18 0944 09/09/18 0950  BP:  102/70  Pulse:  66  Resp:  15  Temp:  98.9 F (37.2 C)  TempSrc:  Oral  SpO2:  98%  Weight:  146 lb 2 oz (66.3 kg)  Height: 5\' 4"  (1.626 m) 5\' 4"  (1.626 m)      Physical Exam Vitals signs and nursing note reviewed.  Constitutional:      Appearance: She is well-developed.  HENT:     Head: Normocephalic and atraumatic.     Nose: Nose normal.  Eyes:     General:        Right eye: No discharge.        Left eye: No discharge.     Conjunctiva/sclera: Conjunctivae normal.  Neck:     Trachea: No tracheal deviation.  Cardiovascular:     Rate and Rhythm: Normal rate and regular rhythm.  Pulmonary:     Effort: Pulmonary effort is normal. No respiratory distress.     Breath sounds: No stridor.  Musculoskeletal: Normal range of motion.  Skin:    General: Skin is warm and dry.     Findings: No rash.     Comments: Left proximal thigh/left groin/perineal area approximately 3 x 2 area of edema with central firm non-mobile oval nodule with overlying skin hyperpigmented, generally tender to the touch without fluctuance, no erythema no associated lymphadenopathy, skin clean dry and intact  Neurological:     Mental Status: She is alert.     Motor: No abnormal muscle tone.     Coordination: Coordination normal.  Psychiatric:        Behavior: Behavior normal.           Assessment & Plan:   43 year old female presents with nodule versus abscess has been coming and going for several months over the last week was more swollen and painful than it ever has been she has had no drainage but it has  started to decrease in size.  I do suspect a resolving abscess versus cyst.  There is no overlying erythema or induration, no fluctuance.  May be a inflamed cyst or boil that has began to improve on its own, there is no pustule, she reports no drainage,  does not appear to have active spreading infection and patient is generally low risk without history of MRSA, immunocompromise, diabetes.  She would like to try antibiotics to see if it will help it resolve all the way.  I explained to her that I&D is first-line treatment and for surrounding cellulitis antibiotics are used to treat she may not see very much improvement with a short course of steroids but willing to try to see if it will bring it to ahead with warm compresses or help it continue to decrease in size and tenderness.  I have encouraged her to return here if it becomes acutely inflamed red swollen tender and needs to be drained, but sounds more like there is a history of a cyst or boil and she need to have it completely excised to get it to fully resolve I have explained this to her and advised that we can refer her to either dermatology or general surgery for this patient wishes to wait and see and let us know.    ICD-10-CM   1. Abscess L02.91   2. Cellulitis of left lower extremity L03.116        Delsa Grana, PA-C 09/09/18 10:07 AM

## 2018-09-09 NOTE — Patient Instructions (Signed)
Skin Abscess  A skin abscess is an infected area on or under your skin that contains a collection of pus and other material. An abscess may also be called a furuncle, carbuncle, or boil. An abscess can occur in or on almost any part of your body. Some abscesses break open (rupture) on their own. Most continue to get worse unless they are treated. The infection can spread deeper into the body and eventually into your blood, which can make you feel ill. Treatment usually involves draining the abscess. What are the causes? An abscess occurs when germs, like bacteria, Golla through your skin and cause an infection. This may be caused by:  A scrape or cut on your skin.  A puncture wound through your skin, including a needle injection or insect bite.  Blocked oil or sweat glands.  Blocked and infected hair follicles.  A cyst that forms beneath your skin (sebaceous cyst) and becomes infected. What increases the risk? This condition is more likely to develop in people who:  Have a weak body defense system (immune system).  Have diabetes.  Have dry and irritated skin.  Get frequent injections or use illegal IV drugs.  Have a foreign body in a wound, such as a splinter.  Have problems with their lymph system or veins. What are the signs or symptoms? Symptoms of this condition include:  A painful, firm bump under the skin.  A bump with pus at the top. This may break through the skin and drain. Other symptoms include:  Redness surrounding the abscess site.  Warmth.  Swelling of the lymph nodes (glands) near the abscess.  Tenderness.  A sore on the skin. How is this diagnosed? This condition may be diagnosed based on:  A physical exam.  Your medical history.  A sample of pus. This may be used to find out what is causing the infection.  Blood tests.  Imaging tests, such as an ultrasound, CT scan, or MRI. How is this treated? A small abscess that drains on its own may not  need treatment. Treatment for larger abscesses may include:  Moist heat or heat pack applied to the area several times a day.  A procedure to drain the abscess (incision and drainage).  Antibiotic medicines. For a severe abscess, you may first get antibiotics through an IV and then change to antibiotics by mouth. Follow these instructions at home: Medicines   Take over-the-counter and prescription medicines only as told by your health care provider.  If you were prescribed an antibiotic medicine, take it as told by your health care provider. Do not stop taking the antibiotic even if you start to feel better. Abscess care   If you have an abscess that has not drained, apply heat to the affected area. Use the heat source that your health care provider recommends, such as a moist heat pack or a heating pad. ? Place a towel between your skin and the heat source. ? Leave the heat on for 20-30 minutes. ? Remove the heat if your skin turns bright red. This is especially important if you are unable to feel pain, heat, or cold. You may have a greater risk of getting burned.  Follow instructions from your health care provider about how to take care of your abscess. Make sure you: ? Cover the abscess with a bandage (dressing). ? Change your dressing or gauze as told by your health care provider. ? Wash your hands with soap and water before you change the   dressing or gauze. If soap and water are not available, use hand sanitizer.  Check your abscess every day for signs of a worsening infection. Check for: ? More redness, swelling, or pain. ? More fluid or blood. ? Warmth. ? More pus or a bad smell. General instructions  To avoid spreading the infection: ? Do not share personal care items, towels, or hot tubs with others. ? Avoid making skin contact with other people.  Keep all follow-up visits as told by your health care provider. This is important. Contact a health care provider if you  have:  More redness, swelling, or pain around your abscess.  More fluid or blood coming from your abscess.  Warm skin around your abscess.  More pus or a bad smell coming from your abscess.  A fever.  Muscle aches.  Chills or a general ill feeling. Get help right away if you:  Have severe pain.  See red streaks on your skin spreading away from the abscess. Summary  A skin abscess is an infected area on or under your skin that contains a collection of pus and other material.  A small abscess that drains on its own may not need treatment.  Treatment for larger abscesses may include having a procedure to drain the abscess and taking an antibiotic. This information is not intended to replace advice given to you by your health care provider. Make sure you discuss any questions you have with your health care provider. Document Released: 03/28/2005 Document Revised: 08/01/2017 Document Reviewed: 08/01/2017 Elsevier Interactive Patient Education  2019 [employer]  

## 2018-11-10 MED FILL — FLUCONAZOLE 150 MG TABS: 150 | 2 days supply | Qty: 2 | Fill #1

## 2018-12-02 ENCOUNTER — Telehealth: Payer: 59 | Admitting: Physician Assistant

## 2018-12-02 DIAGNOSIS — R300 Dysuria: Secondary | ICD-10-CM | POA: Diagnosis not present

## 2018-12-02 MED ORDER — NITROFURANTOIN MONOHYD MACRO 100 MG PO CAPS: 100.0000 mg | ORAL_CAPSULE | Freq: Two times a day (BID) | ORAL | 0 refills | Status: AC

## 2018-12-02 MED FILL — NITROFURANTOIN MONO-MCR 100: 100 | 5 days supply | Qty: 10 | Fill #0

## 2018-12-02 NOTE — Progress Notes (Signed)
We are sorry that you are not feeling well.  Here is how we plan to help!  Based on what you shared with me it looks like you most likely have a simple urinary tract infection.  A UTI (Urinary Tract Infection) is a bacterial infection of the bladder.  Most cases of urinary tract infections are simple to treat but a key part of your care is to encourage you to drink plenty of fluids and watch your symptoms carefully.  I have prescribed MacroBid 100 mg twice a day for 5 days.  Your symptoms should gradually improve. Call us if the burning in your urine worsens, you develop worsening fever, back pain or pelvic pain or if your symptoms do not resolve after completing the antibiotic.  Urinary tract infections can be prevented by drinking plenty of water to keep your body hydrated.  Also be sure when you wipe, wipe from front to back and don't hold it in!  If possible, empty your bladder every 4 hours.  Your e-visit answers were reviewed by a board certified advanced clinical practitioner to complete your personal care plan.  Depending on the condition, your plan could have included both over the counter or prescription medications.  If there is a problem please reply once you have received a response from your provider.  Your safety is important to [place].  If you have drug allergies check your prescription carefully.    You can use MyChart to ask questions about today's visit, request a non-urgent call back, or ask for a work or school excuse for 24 hours related to this e-Visit. If it has been greater than 24 hours you will need to follow up with your provider, or enter a new e-Visit to address those concerns.   You will get an e-mail in the next two days asking about your experience.  I hope that your e-visit has been valuable and will speed your recovery. Thank you for using e-visits.    ===View-only below this line===   ----- Message -----    From: Charlann Lange    Sent: 12/02/2018  9:29 AM  EDT      To: E-Visit Mailing List Subject: E-Visit Submission: Urinary Problems  E-Visit Submission: Urinary Problems --------------------------------  Question: Which of the following are you experiencing? Answer:   Pain while passing urine            Difficulty passing urine            Change in urine appearance or smell  Question: When you have pain when passing urine, which of these apply? Answer:   I have a burning sensation  Question: Are you able to Bazzano urine? Answer:   Yes, I can Ritchie urine.  Question: How long have you had pain or difficulty passing urine? Answer:   Two days or less  Question: Do you have a fever? Answer:   No, I do not have a fever  Question: Do you have any of the following? Answer:   I have none of these problems  Question: Do you have an exaggerated sensation of the need to Klarich urine? Answer:   Yes, the sensation is exaggerated  Question: Do you have the urge to urinate more of less frequently than normal? Answer:   More frequently  Question: What does your urine look like? Answer:   It is cloudy  Question: Do you have any of the following? Answer:   Blood in your urine  An unusual smell  Question: Do you have any of the following? Answer:   No discharge  Question: Do you have any sores on your genitals? Answer:   No  Question: Do you have any history of kidney dysfunction or kidney problems? Answer:   No  Question: Within the past 3 months, have you had any surgery on your kidneys or bladder, or have you had a tube inserted to collect your urine? Answer:   No, I have never had either  Question: Have you had similar symptoms in the past? Answer:   Yes, I have had similar symptoms before  Question: If you had similar symptoms in the past, did any of the following work? Answer:   Pills for urine infection            Cranberry juice  Question: Please list any additional comments  Answer:     Question: Please list  your medication allergies that you may have ? (If 'none' , please list as 'none') Answer:   None  Question: Are you pregnant? Answer:   I am confident that I am not pregnant  Question: Are you breastfeeding? Answer:   No  A total of 5-10 minutes was spent evaluating this patients questionnaire and formulating a plan of care.

## 2018-12-03 ENCOUNTER — Ambulatory Visit: Payer: 59 | Admitting: Family Medicine

## 2019-01-13 MED FILL — KETOCONAZOLE 2 % SHAM: 2 | 30 days supply | Qty: 120 | Fill #0

## 2019-01-13 MED FILL — KETOCONAZOLE 2% CREAM: 2 | 7 days supply | Qty: 15 | Fill #0

## 2019-04-14 ENCOUNTER — Telehealth: Payer: 59 | Admitting: Physician Assistant

## 2019-04-14 DIAGNOSIS — R300 Dysuria: Secondary | ICD-10-CM | POA: Diagnosis not present

## 2019-04-14 MED ORDER — CEPHALEXIN 500 MG PO CAPS: ORAL_CAPSULE | ORAL | 0 refills | Status: DC

## 2019-04-14 MED FILL — CEPHALEXIN 500 MG CAPSULE: 500 | 7 days supply | Qty: 14 | Fill #0

## 2019-04-14 NOTE — Progress Notes (Signed)

## 2019-05-29 ENCOUNTER — Telehealth: Payer: 59 | Admitting: Family

## 2019-05-29 DIAGNOSIS — N760 Acute vaginitis: Secondary | ICD-10-CM

## 2019-05-29 MED ORDER — FLUCONAZOLE 150 MG PO TABS: 150.0000 mg | ORAL_TABLET | Freq: Once | ORAL | 0 refills | Status: AC

## 2019-05-29 MED FILL — FLUCONAZOLE 150 MG TABLET: 150 | 1 days supply | Qty: 1 | Fill #0

## 2019-05-29 NOTE — Progress Notes (Signed)

## 2019-06-16 ENCOUNTER — Other Ambulatory Visit: Payer: Self-pay | Admitting: Family Medicine

## 2019-06-16 DIAGNOSIS — Z1231 Encounter for screening mammogram for malignant neoplasm of breast: Secondary | ICD-10-CM

## 2019-06-29 ENCOUNTER — Encounter: Payer: 59 | Admitting: Family Medicine

## 2019-07-15 ENCOUNTER — Telehealth: Payer: 59 | Admitting: Nurse Practitioner

## 2019-07-15 DIAGNOSIS — R300 Dysuria: Secondary | ICD-10-CM | POA: Diagnosis not present

## 2019-07-15 MED ORDER — CEPHALEXIN 500 MG PO CAPS: 500.0000 mg | ORAL_CAPSULE | Freq: Two times a day (BID) | ORAL | 0 refills | Status: DC

## 2019-07-15 MED FILL — CEPHALEXIN 500 MG CAPSULE: 500 | 7 days supply | Qty: 14 | Fill #0

## 2019-07-15 NOTE — Progress Notes (Signed)

## 2019-08-03 ENCOUNTER — Encounter: Payer: Self-pay | Admitting: Family Medicine

## 2019-08-05 ENCOUNTER — Other Ambulatory Visit: Payer: Self-pay

## 2019-08-05 ENCOUNTER — Ambulatory Visit
Admission: RE | Admit: 2019-08-05 | Discharge: 2019-08-05 | Disposition: A | Discharge status: Home / Self Care | Payer: 59 | Source: Ambulatory Visit | Attending: Family Medicine | Admitting: Family Medicine

## 2019-08-05 DIAGNOSIS — Z1231 Encounter for screening mammogram for malignant neoplasm of breast: Secondary | ICD-10-CM

## 2019-08-06 ENCOUNTER — Encounter: Payer: Self-pay | Admitting: Family Medicine

## 2019-08-06 ENCOUNTER — Other Ambulatory Visit: Payer: Self-pay | Admitting: Family Medicine

## 2019-08-06 DIAGNOSIS — R928 Other abnormal and inconclusive findings on diagnostic imaging of breast: Secondary | ICD-10-CM

## 2019-08-13 ENCOUNTER — Other Ambulatory Visit: Payer: Self-pay

## 2019-08-13 ENCOUNTER — Other Ambulatory Visit: Payer: Self-pay | Admitting: Family Medicine

## 2019-08-13 ENCOUNTER — Ambulatory Visit
Admission: RE | Admit: 2019-08-13 | Discharge: 2019-08-13 | Disposition: A | Discharge status: Home / Self Care | Payer: 59 | Source: Ambulatory Visit | Attending: Family Medicine | Admitting: Family Medicine

## 2019-08-13 DIAGNOSIS — N6489 Other specified disorders of breast: Secondary | ICD-10-CM | POA: Diagnosis not present

## 2019-08-13 DIAGNOSIS — R599 Enlarged lymph nodes, unspecified: Secondary | ICD-10-CM

## 2019-08-13 DIAGNOSIS — R928 Other abnormal and inconclusive findings on diagnostic imaging of breast: Secondary | ICD-10-CM

## 2019-08-24 ENCOUNTER — Encounter: Payer: Self-pay | Admitting: Family Medicine

## 2019-08-24 ENCOUNTER — Other Ambulatory Visit: Payer: Self-pay

## 2019-08-24 ENCOUNTER — Ambulatory Visit (INDEPENDENT_AMBULATORY_CARE_PROVIDER_SITE_OTHER): Payer: 59 | Admitting: Family Medicine

## 2019-08-24 VITALS — BP 118/64 | HR 66 | Temp 98.2°F | Resp 14 | Ht 64.0 in | Wt 153.0 lb

## 2019-08-24 DIAGNOSIS — H6123 Impacted cerumen, bilateral: Secondary | ICD-10-CM | POA: Diagnosis not present

## 2019-08-24 NOTE — Progress Notes (Signed)
   Subjective:    Patient ID: Jennifer Hansen, female    DOB: 05/27/1976, 44 y.o.   MRN: [id number]  Patient presents for L Ear Pressure (x2 days- pressure in L ear- feels like it's clogged up- no pain, drainage)  Left ear pressure with mild discomfort discomfort, her hearing is muffled in the left ear past few days.  She feels like there may be wax buildup.  She did try home wax removal kit but nothing came out.  She denies any drainage from the ear.  No URI symptoms accompanying.  No drainage from ear    May 12TH scheduled foR repeat ultrasound of the axilla.  She had screening mammogram which resulted in concern for a mass in the axilla.  Was found to be a reactive lymph node from her Covid vaccine  Review Of Systems:  GEN- denies fatigue, fever, weight loss,weakness, recent illness HEENT- denies eye drainage, change in vision, nasal discharge, CVS- denies chest pain, palpitations RESP- denies SOB, cough, wheeze Neuro- denies headache, dizziness, syncope, seizure activity       Objective:    BP 118/64   Pulse 66   Temp 98.2 F (36.8 C) (Temporal)   Resp 14   Ht '5\' 4"'$  (1.626 m)   Wt 153 lb (69.4 kg)   SpO2 95%   BMI 26.26 kg/m  GEN- NAD, alert and oriented x3 HEENT- PERRL, EOMI, non injected sclera, cerumen impaction bilat, s/p irrigation, TM in tact, canals clear , no erythema  Neck- Supple, no LAD  CVS- RRR, no murmur RESP-CTAB         Assessment & Plan:      Problem List Items Addressed This Visit    None    Visit Diagnoses    Bilateral impacted cerumen    -  Primary   s/p irrigation, no underlying infection      Note: This dictation was prepared with Dragon dictation along with smaller phrase technology. Any transcriptional errors that result from this process are unintentional.

## 2019-08-25 ENCOUNTER — Telehealth: Payer: 59 | Admitting: Physician Assistant

## 2019-08-25 DIAGNOSIS — N390 Urinary tract infection, site not specified: Secondary | ICD-10-CM

## 2019-08-25 DIAGNOSIS — Z0100 Encounter for examination of eyes and vision without abnormal findings: Secondary | ICD-10-CM | POA: Diagnosis not present

## 2019-08-25 MED ORDER — CEPHALEXIN 500 MG PO CAPS: ORAL_CAPSULE | ORAL | 0 refills | Status: DC

## 2019-08-25 MED FILL — CEPHALEXIN 500 MG CAPSULE: 500 | 7 days supply | Qty: 14 | Fill #0

## 2019-08-25 NOTE — Progress Notes (Signed)

## 2019-09-28 ENCOUNTER — Encounter: Payer: 59 | Admitting: Family Medicine

## 2019-10-06 ENCOUNTER — Other Ambulatory Visit: Payer: Self-pay

## 2019-10-06 ENCOUNTER — Ambulatory Visit (INDEPENDENT_AMBULATORY_CARE_PROVIDER_SITE_OTHER): Payer: 59 | Admitting: Nurse Practitioner

## 2019-10-06 VITALS — BP 102/68 | HR 94 | Temp 98.4°F | Resp 18 | Ht 64.0 in | Wt 156.2 lb

## 2019-10-06 DIAGNOSIS — R399 Unspecified symptoms and signs involving the genitourinary system: Secondary | ICD-10-CM | POA: Diagnosis not present

## 2019-10-06 DIAGNOSIS — R3915 Urgency of urination: Secondary | ICD-10-CM

## 2019-10-06 DIAGNOSIS — N3001 Acute cystitis with hematuria: Secondary | ICD-10-CM

## 2019-10-06 DIAGNOSIS — R350 Frequency of micturition: Secondary | ICD-10-CM | POA: Diagnosis not present

## 2019-10-06 LAB — URINALYSIS, ROUTINE W REFLEX MICROSCOPIC
Bilirubin Urine: NEGATIVE
Hyaline Cast: NONE SEEN /LPF
Nitrite: POSITIVE — AB
Specific Gravity, Urine: 1.02 (ref 1.001–1.03)
pH: 5.5 (ref 5.0–8.0)

## 2019-10-06 MED ORDER — NITROFURANTOIN MACROCRYSTAL 100 MG PO CAPS: 100.0000 mg | ORAL_CAPSULE | Freq: Four times a day (QID) | ORAL | 0 refills | Status: DC

## 2019-10-06 MED FILL — NITROFURANTOIN MCR 100 MG C: 100 | 4 days supply | Qty: 14 | Fill #0

## 2019-10-06 NOTE — Progress Notes (Signed)
Acute Office Visit  Subjective:    Patient ID: Jennifer Hansen, female    DOB: 10-17-1975, 44 y.o.   MRN: [id number]  Chief Complaint  Patient presents with  . Urinary Tract Infection    urination frequency, pain with urination, azo was taken with no relief, started 04/05    HPI Patient is a 44 year old female presenting for urinary urgency, frequency, after void pressure. She denied fever/chills, n/v, back pain, recent constipation. She had started using AZO otc stopped yesterday.   Past Medical History:  Diagnosis Date  . Acne     Past Surgical History:  Procedure Laterality Date  . BUNIONECTOMY    . TUBAL LIGATION  2004    Family History  Problem Relation Age of Onset  . Cancer Maternal Grandmother        colon cancer  . Diabetes Maternal Grandmother   . Heart disease Maternal Grandmother   . Diabetes Father   . Arthritis Father   . Aneurysm Paternal Grandfather   . Cancer Maternal Grandfather   . Arthritis Mother     Social History   Socioeconomic History  . Marital status: Widowed    Spouse name: Not on file  . Number of children: Not on file  . Years of education: Not on file  . Highest education level: Not on file  Occupational History  . Not on file  Tobacco Use  . Smoking status: Never Smoker  . Smokeless tobacco: Never Used  Substance and Sexual Activity  . Alcohol use: No  . Drug use: No  . Sexual activity: Yes    Birth control/protection: Surgical  Other Topics Concern  . Not on file  Social History Narrative  . Not on file   Social Determinants of Health   Financial Resource Strain:   . Difficulty of Paying Living Expenses:   Food Insecurity:   . Worried About Charity fundraiser in the Last Year:   . Arboriculturist in the Last Year:   Transportation Needs:   . Film/video editor (Medical):   Marland Kitchen Lack of Transportation (Non-Medical):   Physical Activity:   . Days of Exercise per Week:   . Minutes of Exercise per Session:     Stress:   . Feeling of Stress :   Social Connections:   . Frequency of Communication with Friends and Family:   . Frequency of Social Gatherings with Friends and Family:   . Attends Religious Services:   . Active Member of Clubs or Organizations:   . Attends Archivist Meetings:   Marland Kitchen Marital Status:   Intimate Partner Violence:   . Fear of Current or Ex-Partner:   . Emotionally Abused:   Marland Kitchen Physically Abused:   . Sexually Abused:     Outpatient Medications Prior to Visit  Medication Sig Dispense Refill  . Cholecalciferol (HM VITAMIN D) 1000 units tablet Take 1 tablet (1,000 Units total) by mouth daily. 30 tablet 0  . Multiple Vitamin (MULTIVITAMIN WITH MINERALS) TABS tablet Take 1 tablet by mouth daily.    Marland Kitchen triamcinolone cream (KENALOG) 0.1 % Apply 1 application topically 2 (two) times daily. 30 g 1  . omeprazole (PRILOSEC) 40 MG capsule Take 1 capsule (40 mg total) by mouth daily. 30 capsule 3  . cephALEXin (KEFLEX) 500 MG capsule 1 cap po bid x 7 days 14 capsule 0   No facility-administered medications prior to visit.    No Known Allergies  Review  of Systems  All other systems reviewed and are negative.      Objective:    Physical Exam Vitals and nursing note reviewed.  Constitutional:      General: She is not in acute distress.    Appearance: Normal appearance. She is not ill-appearing or toxic-appearing.  HENT:     Head: Normocephalic.  Eyes:     Extraocular Movements: Extraocular movements intact.     Conjunctiva/sclera: Conjunctivae normal.     Pupils: Pupils are equal, round, and reactive to light.  Cardiovascular:     Rate and Rhythm: Normal rate.  Pulmonary:     Effort: Pulmonary effort is normal.  Abdominal:     Tenderness: There is no right CVA tenderness or left CVA tenderness.  Musculoskeletal:        General: Normal range of motion.     Cervical back: Normal range of motion and neck supple.  Neurological:     General: No focal deficit  present.     Mental Status: She is alert and oriented to person, place, and time.  Psychiatric:        Mood and Affect: Mood normal.     BP 102/68 (BP Location: Left Arm, Patient Position: Sitting, Cuff Size: Normal)   Pulse 94   Temp 98.4 F (36.9 C) (Temporal)   Resp 18   Ht 5\' 4"  (1.626 m)   Wt 156 lb 3.2 oz (70.9 kg)   SpO2 98%   BMI 26.81 kg/m  Wt Readings from Last 3 Encounters:  10/06/19 156 lb 3.2 oz (70.9 kg)  08/24/19 153 lb (69.4 kg)  09/09/18 146 lb 2 oz (66.3 kg)    Health Maintenance Due  Topic Date Due  . PAP SMEAR-Modifier  06/05/2019    There are no preventive care reminders to display for this patient.   Lab Results  Component Value Date   TSH 0.88 06/20/2017   Lab Results  Component Value Date   WBC 4.6 06/23/2018   HGB 13.1 06/23/2018   HCT 38.7 06/23/2018   MCV 93.9 06/23/2018   PLT 269 06/23/2018   Lab Results  Component Value Date   NA 141 06/23/2018   K 5.2 06/23/2018   CO2 26 06/23/2018   GLUCOSE 72 06/23/2018   BUN 11 06/23/2018   CREATININE 0.91 06/23/2018   BILITOT 0.2 06/23/2018   ALKPHOS 53 06/04/2016   AST 17 06/23/2018   ALT 13 06/23/2018   PROT 6.9 06/23/2018   ALBUMIN 4.3 06/04/2016   CALCIUM 10.0 06/23/2018   Lab Results  Component Value Date   CHOL 196 06/23/2018   Lab Results  Component Value Date   HDL 71 06/23/2018   Lab Results  Component Value Date   LDLCALC 108 (H) 06/23/2018   Lab Results  Component Value Date   TRIG 83 06/23/2018   Lab Results  Component Value Date   CHOLHDL 2.8 06/23/2018   No results found for: HGBA1C     Assessment & Plan:  You have a UTI and should take otc AZO as before and start/complete the antibiotic called into pharmacy.  Please keep apt next week with your regular PCP to discuss frequent UTI Printed education on UTI.  Discussed Ecoli from bowels as possibility as Nitrate positive and pt understands proper wiping after evacuating, urinating after  intercourse. Drink plenty of water  Problem List Items Addressed This Visit    None    Visit Diagnoses    UTI symptoms    -  Primary   Relevant Medications   nitrofurantoin (MACRODANTIN) 100 MG capsule   Other Relevant Orders   Urinalysis, Routine w reflex microscopic   Urinary frequency       Relevant Medications   nitrofurantoin (MACRODANTIN) 100 MG capsule   Acute cystitis with hematuria       Relevant Medications   nitrofurantoin (MACRODANTIN) 100 MG capsule   Urgency of urination       Relevant Medications   nitrofurantoin (MACRODANTIN) 100 MG capsule       Meds ordered this encounter  Medications  . nitrofurantoin (MACRODANTIN) 100 MG capsule    Sig: Take 1 capsule (100 mg total) by mouth 4 (four) times daily.    Dispense:  14 capsule    Refill:  0    Follow Up: as needed for non resolving or worsening sxs.  Annie Main, FNP

## 2019-10-06 NOTE — Patient Instructions (Signed)

## 2019-10-09 ENCOUNTER — Encounter: Payer: Self-pay | Admitting: Family Medicine

## 2019-10-09 ENCOUNTER — Ambulatory Visit (INDEPENDENT_AMBULATORY_CARE_PROVIDER_SITE_OTHER): Payer: 59 | Admitting: Family Medicine

## 2019-10-09 ENCOUNTER — Other Ambulatory Visit: Payer: Self-pay

## 2019-10-09 VITALS — BP 122/74 | HR 76 | Temp 97.7°F | Resp 14 | Ht 64.0 in | Wt 154.0 lb

## 2019-10-09 DIAGNOSIS — R87612 Low grade squamous intraepithelial lesion on cytologic smear of cervix (LGSIL): Secondary | ICD-10-CM | POA: Diagnosis not present

## 2019-10-09 DIAGNOSIS — R81 Glycosuria: Secondary | ICD-10-CM | POA: Diagnosis not present

## 2019-10-09 DIAGNOSIS — N3001 Acute cystitis with hematuria: Secondary | ICD-10-CM

## 2019-10-09 DIAGNOSIS — Z0000 Encounter for general adult medical examination without abnormal findings: Secondary | ICD-10-CM

## 2019-10-09 DIAGNOSIS — L219 Seborrheic dermatitis, unspecified: Secondary | ICD-10-CM

## 2019-10-09 DIAGNOSIS — Z124 Encounter for screening for malignant neoplasm of cervix: Secondary | ICD-10-CM

## 2019-10-09 DIAGNOSIS — Z0001 Encounter for general adult medical examination with abnormal findings: Secondary | ICD-10-CM

## 2019-10-09 DIAGNOSIS — E559 Vitamin D deficiency, unspecified: Secondary | ICD-10-CM | POA: Diagnosis not present

## 2019-10-09 MED ORDER — NITROFURANTOIN MACROCRYSTAL 100 MG PO CAPS: 100.0000 mg | ORAL_CAPSULE | Freq: Two times a day (BID) | ORAL | 0 refills | Status: DC

## 2019-10-09 MED ORDER — FLUCONAZOLE 150 MG PO TABS: ORAL_TABLET | ORAL | 0 refills | Status: DC

## 2019-10-09 MED ORDER — FLUOCINOLONE ACETONIDE BODY 0.01 % EX OIL: TOPICAL_OIL | CUTANEOUS | 2 refills | Status: DC

## 2019-10-09 MED ORDER — KETOCONAZOLE 2 % EX SHAM: 1.0000 "application " | MEDICATED_SHAMPOO | CUTANEOUS | 1 refills | Status: DC

## 2019-10-09 MED FILL — FLUOCINOLONE ACETONIDE BODY: 0.01 | 14 days supply | Qty: 118 | Fill #0

## 2019-10-09 MED FILL — KETOCONAZOLE 2 % SHAM: 2 | 14 days supply | Qty: 120 | Fill #0

## 2019-10-09 MED FILL — FLUCONAZOLE 150 MG TABLET: 150 | 3 days supply | Qty: 2 | Fill #0

## 2019-10-09 NOTE — Progress Notes (Signed)
   Subjective:    Patient ID: Jennifer Hansen, female    DOB: [birth year], 44 y.o.   MRN: [id number]  Patient presents for Gynecologic Exam (is fasting)  Pt here for CPE and fasting labs meds and history reviewed Due for PAP Smear   Immunizations UTD - TDAP/FLU/COVID-19   LMP- March 30th, regular menses   No vision or hearing problems   Mammogram UTD, due to abnormal lymph node after COVID 19 vaccine she has repeat US next month    Seen earlier this week for UTI, given macrobid but she had questions about dosing prescribed four times a day, and she does not have enough med to last  Also request diflucan  History of seborrhea in her scalp, needs refill on nizoral shampoo and dermasmoothe oil    Review Of Systems:  GEN- denies fatigue, fever, weight loss,weakness, recent illness HEENT- denies eye drainage, change in vision, nasal discharge, CVS- denies chest pain, palpitations RESP- denies SOB, cough, wheeze ABD- denies N/V, change in stools, abd pain GU- denies dysuria, hematuria, dribbling, incontinence MSK- denies joint pain, muscle aches, injury Neuro- denies headache, dizziness, syncope, seizure activity       Objective:    BP 122/74   Pulse 76   Temp 97.7 F (36.5 C) (Temporal)   Resp 14   Ht 5\' 4"  (1.626 m)   Wt 154 lb (69.9 kg)   LMP 09/29/2019   SpO2 99%   BMI 26.43 kg/m  GEN- NAD, alert and oriented x3 HEENT- PERRL, EOMI, non injected sclera, pink conjunctiva, MMM, oropharynx clear Neck- Supple, no thyromegaly Breast- normal symmetry, no nipple inversion,no nipple drainage, no nodules or lumps felt Nodes- no axillary nodes CVS- RRR, no murmur RESP-CTAB ABD-NABS,soft,NT,ND GU- normal external genitalia, vaginal mucosa pink and moist, cervix visualized no growth, no blood form os, + discharge, no CMT, no ovarian masses, uterus normal size Psych normal affect and mood SKin in tact EXT- No edema Pulses- Radial, DP- 2+        Assessment & Plan:       Problem List Items Addressed This Visit    None    Visit Diagnoses    Routine general medical examination at a health care facility    -  Primary   CPE done, fasting labs obtained, immunizations UTD, noted to have trace glucose on UA check A1 with labs    Relevant Orders   CBC with Differential/Platelet   Comprehensive metabolic panel   Lipid panel   TSH   Acute cystitis with hematuria       chagne macrobid to 100mg  BID, given diflucan for yeast infection   Relevant Medications   nitrofurantoin (MACRODANTIN) 100 MG capsule   Cervical cancer screening       Relevant Orders   Pap IG w/ reflex to HPV when ASC-U   Glucose found in urine on examination       Relevant Orders   Hemoglobin A1c   Vitamin D deficiency       Relevant Orders   Vitamin D, 25-hydroxy   Seborrheic dermatitis of scalp       Nizoral given and dermasmoothe oil      Note: This dictation was prepared with Dragon dictation along with smaller phrase technology. Any transcriptional errors that result from this process are unintentional.

## 2019-10-09 NOTE — Patient Instructions (Addendum)
Macrobid 100mg  twice a day until finished  Diflucan sent to pharmacy  F/U 1 year for Physical

## 2019-10-10 LAB — LIPID PANEL
Cholesterol: 200 mg/dL — ABNORMAL HIGH (ref <200)
HDL: 67 mg/dL (ref >=50)
LDL Cholesterol (Calc): 119 mg/dL (calc) — ABNORMAL HIGH
Non-HDL Cholesterol (Calc): 133 mg/dL (calc) — ABNORMAL HIGH (ref <130)
Total CHOL/HDL Ratio: 3 (calc) (ref <5.0)
Triglycerides: 57 mg/dL (ref <150)

## 2019-10-10 LAB — COMPREHENSIVE METABOLIC PANEL
AG Ratio: 1.6 (calc) (ref 1.0–2.5)
ALT: 15 U/L (ref 6–29)
AST: 14 U/L (ref 10–30)
Albumin: 4.2 g/dL (ref 3.6–5.1)
Alkaline phosphatase (APISO): 52 U/L (ref 31–125)
BUN: 8 mg/dL (ref 7–25)
CO2: 27 mmol/L (ref 20–32)
Calcium: 9.1 mg/dL (ref 8.6–10.2)
Chloride: 103 mmol/L (ref 98–110)
Creat: 0.86 mg/dL (ref 0.50–1.10)
Globulin: 2.6 g/dL (calc) (ref 1.9–3.7)
Glucose, Bld: 86 mg/dL (ref 65–99)
Potassium: 3.9 mmol/L (ref 3.5–5.3)
Sodium: 139 mmol/L (ref 135–146)
Total Bilirubin: 0.3 mg/dL (ref 0.2–1.2)
Total Protein: 6.8 g/dL (ref 6.1–8.1)

## 2019-10-10 LAB — CBC WITH DIFFERENTIAL/PLATELET
Absolute Monocytes: 689 cells/uL (ref 200–950)
Basophils Absolute: 20 cells/uL (ref 0–200)
Basophils Relative: 0.4 %
Eosinophils Absolute: 112 cells/uL (ref 15–500)
Eosinophils Relative: 2.2 %
HCT: 38.7 % (ref 35.0–45.0)
Hemoglobin: 12.8 g/dL (ref 11.7–15.5)
Lymphs Abs: 2086 cells/uL (ref 850–3900)
MCH: 31.7 pg (ref 27.0–33.0)
MCHC: 33.1 g/dL (ref 32.0–36.0)
MCV: 95.8 fL (ref 80.0–100.0)
MPV: 10 fL (ref 7.5–12.5)
Monocytes Relative: 13.5 %
Neutro Abs: 2193 cells/uL (ref 1500–7800)
Neutrophils Relative %: 43 %
Platelets: 286 10*3/uL (ref 140–400)
RBC: 4.04 10*6/uL (ref 3.80–5.10)
RDW: 12.5 % (ref 11.0–15.0)
Total Lymphocyte: 40.9 %
WBC: 5.1 10*3/uL (ref 3.8–10.8)

## 2019-10-10 LAB — HEMOGLOBIN A1C
Hgb A1c MFr Bld: 5.5 % of total Hgb (ref <5.7)
Mean Plasma Glucose: 111 (calc)
eAG (mmol/L): 6.2 (calc)

## 2019-10-10 LAB — VITAMIN D 25 HYDROXY (VIT D DEFICIENCY, FRACTURES): Vit D, 25-Hydroxy: 25 ng/mL — ABNORMAL LOW (ref 30–100)

## 2019-10-10 LAB — TSH: TSH: 1.28 mIU/L

## 2019-10-12 ENCOUNTER — Other Ambulatory Visit: Payer: Self-pay | Admitting: *Deleted

## 2019-10-12 MED ORDER — VITAMIN D (ERGOCALCIFEROL) 1.25 MG (50000 UNIT) PO CAPS: 50000.0000 [IU] | ORAL_CAPSULE | ORAL | 0 refills | Status: DC

## 2019-10-12 MED FILL — VIT D2 1.25 MG (50,000 UNIT: 1.25 MG | 42 days supply | Qty: 6 | Fill #0

## 2019-10-15 LAB — PAP IG W/ RFLX HPV ASCU

## 2019-10-15 LAB — HUMAN PAPILLOMAVIRUS, HIGH RISK: HPV DNA High Risk: DETECTED — AB

## 2019-10-19 ENCOUNTER — Other Ambulatory Visit: Payer: Self-pay | Admitting: *Deleted

## 2019-10-19 DIAGNOSIS — R87612 Low grade squamous intraepithelial lesion on cytologic smear of cervix (LGSIL): Secondary | ICD-10-CM

## 2019-10-19 DIAGNOSIS — B977 Papillomavirus as the cause of diseases classified elsewhere: Secondary | ICD-10-CM

## 2019-10-20 ENCOUNTER — Encounter: Payer: Self-pay | Admitting: Obstetrics & Gynecology

## 2019-10-20 ENCOUNTER — Other Ambulatory Visit: Payer: Self-pay

## 2019-10-20 ENCOUNTER — Other Ambulatory Visit: Payer: Self-pay | Admitting: Obstetrics & Gynecology

## 2019-10-20 ENCOUNTER — Ambulatory Visit: Payer: 59 | Admitting: Obstetrics & Gynecology

## 2019-10-20 DIAGNOSIS — R87612 Low grade squamous intraepithelial lesion on cytologic smear of cervix (LGSIL): Secondary | ICD-10-CM

## 2019-10-20 DIAGNOSIS — Z3202 Encounter for pregnancy test, result negative: Secondary | ICD-10-CM

## 2019-10-20 DIAGNOSIS — N870 Mild cervical dysplasia: Secondary | ICD-10-CM | POA: Diagnosis not present

## 2019-10-20 LAB — POCT URINE PREGNANCY: Preg Test, Ur: NEGATIVE

## 2019-10-20 NOTE — Progress Notes (Signed)
Colposcopy Procedure Note:  Colposcopy Procedure Note  Indications:  2021   LSIL/HPV not performed 2017   ?Normal 2014     Normal    2019 ASCCP recommendation:  Smoker:  No. New sexual partner:  No.  : time frame:    History of abnormal Pap: no  Procedure Details  The risks and benefits of the procedure and Written informed consent obtained.  Speculum placed in vagina and excellent visualization of cervix achieved, cervix swabbed x 3 with acetic acid solution.  Findings: Cervix: acetowhite changes with some punctation at 7 0'clock, suspect low grade dysplasia; SCJ visualized - lesion at 7 o'clock.  Monsel's used Vaginal inspection: normal without visible lesions. Vulvar colposcopy: vulvar colposcopy not performed.  Specimens: cervical biopsy  Complications: none.  Plan(Based on 2019 ASCCP recommendations) MyChart connect visit

## 2019-10-26 ENCOUNTER — Encounter: Payer: 59 | Admitting: Obstetrics & Gynecology

## 2019-10-27 ENCOUNTER — Encounter: Payer: Self-pay | Admitting: Obstetrics & Gynecology

## 2019-10-27 ENCOUNTER — Other Ambulatory Visit: Payer: Self-pay

## 2019-10-27 ENCOUNTER — Telehealth (INDEPENDENT_AMBULATORY_CARE_PROVIDER_SITE_OTHER): Payer: 59 | Admitting: Obstetrics & Gynecology

## 2019-10-27 VITALS — Ht 64.0 in

## 2019-10-27 DIAGNOSIS — N870 Mild cervical dysplasia: Secondary | ICD-10-CM

## 2019-10-27 NOTE — Progress Notes (Signed)
Follow up appointment for results  Chief Complaint  Patient presents with  . Follow-up    discuss biopsy results    Height 5\' 4"  (1.626 m), last menstrual period 10/21/2019.  Low grade dysplasia    MEDS ordered this encounter: No orders of the defined types were placed in this encounter.   Orders for this encounter: No orders of the defined types were placed in this encounter.   Impression:   ICD-10-CM   1. Dysplasia of cervix, low grade (CIN 1)  N87.0    on cervical biopsy     Plan: Repeat HPV based cytology 1 year  Follow Up: No follow-ups on file.       Face to face time:  10 minutes  Greater than 50% of the visit time was spent in counseling and coordination of care with the patient.  The summary and outline of the counseling and care coordination is summarized in the note above.   All questions were answered.  Past Medical History:  Diagnosis Date  . Acne     Past Surgical History:  Procedure Laterality Date  . BUNIONECTOMY    . TUBAL LIGATION  2004    OB History    Gravida  2   Para  2   Term      Preterm      AB      Living  2     SAB      TAB      Ectopic      Multiple      Live Births              No Known Allergies  Social History   Socioeconomic History  . Marital status: Widowed    Spouse name: Not on file  . Number of children: Not on file  . Years of education: Not on file  . Highest education level: Not on file  Occupational History  . Not on file  Tobacco Use  . Smoking status: Never Smoker  . Smokeless tobacco: Never Used  Vaping Use  . Vaping Use: Never used  Substance and Sexual Activity  . Alcohol use: No  . Drug use: No  . Sexual activity: Not Currently    Birth control/protection: Surgical    Comment: tubal  Other Topics Concern  . Not on file  Social History Narrative  . Not on file   Social Determinants of Health   Financial Resource Strain:   . Difficulty of Paying Living  Expenses:   Food Insecurity:   . Worried About Charity fundraiser in the Last Year:   . Arboriculturist in the Last Year:   Transportation Needs:   . Film/video editor (Medical):   Marland Kitchen Lack of Transportation (Non-Medical):   Physical Activity:   . Days of Exercise per Week:   . Minutes of Exercise per Session:   Stress:   . Feeling of Stress :   Social Connections:   . Frequency of Communication with Friends and Family:   . Frequency of Social Gatherings with Friends and Family:   . Attends Religious Services:   . Active Member of Clubs or Organizations:   . Attends Archivist Meetings:   Marland Kitchen Marital Status:     Family History  Problem Relation Age of Onset  . Cancer Maternal Grandmother        colon cancer  . Diabetes Maternal Grandmother   . Heart disease Maternal Grandmother   .  Diabetes Father   . Arthritis Father   . Aneurysm Paternal Grandfather   . Cancer Maternal Grandfather   . Arthritis Mother

## 2019-10-28 IMAGING — MG DIGITAL SCREENING BILATERAL MAMMOGRAM WITH TOMO AND CAD
8 series · 9 of 24 positions shown · non-contrast
Comparison: Previous exam(s).

CLINICAL DATA: Screening.

EXAM:
DIGITAL SCREENING BILATERAL MAMMOGRAM WITH TOMO AND CAD

[L CC synth-2D]
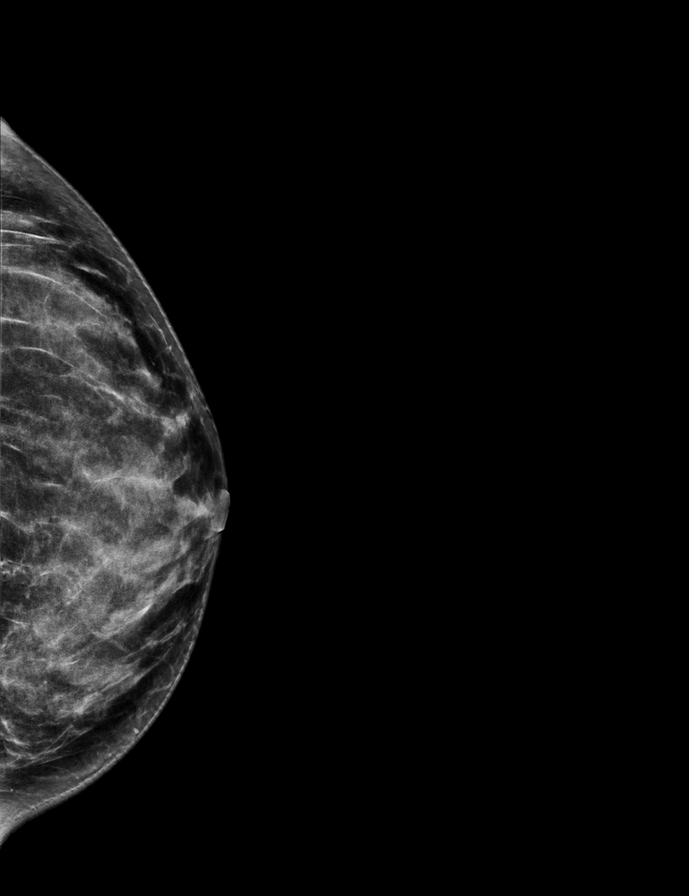

[R CC synth-2D]
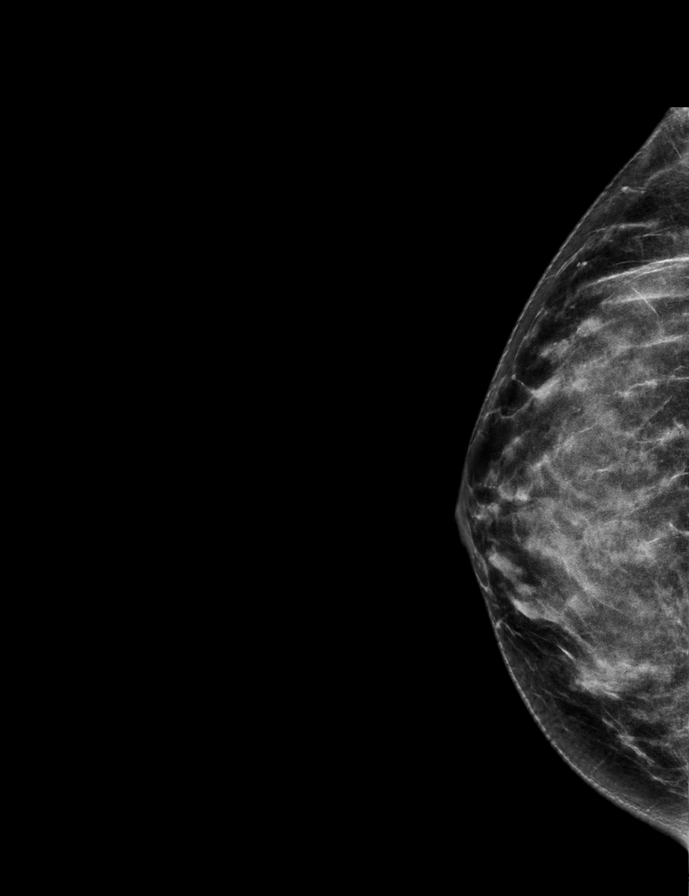

[R MLO synth-2D]
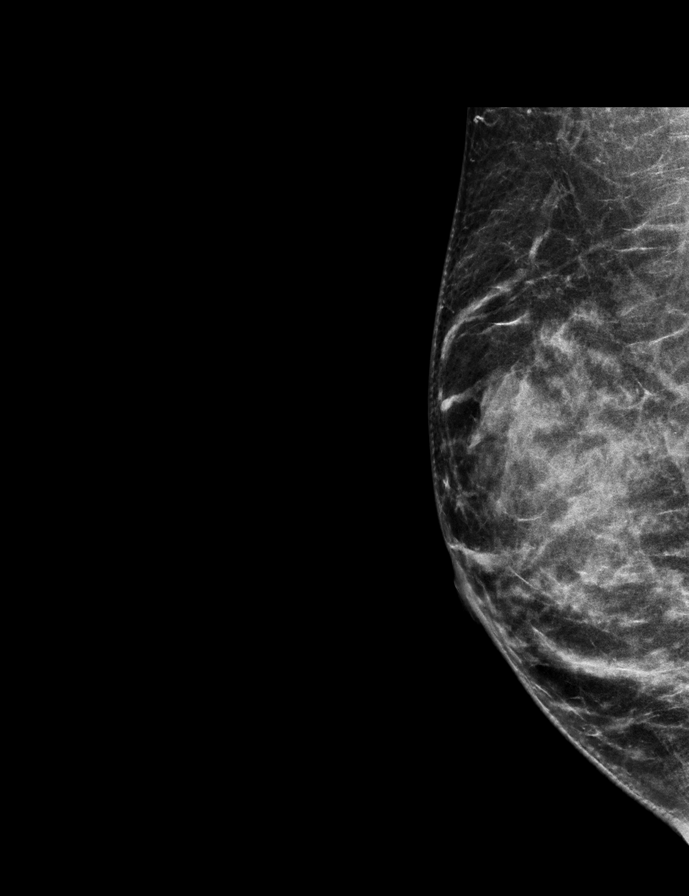

[L MLO synth-2D]
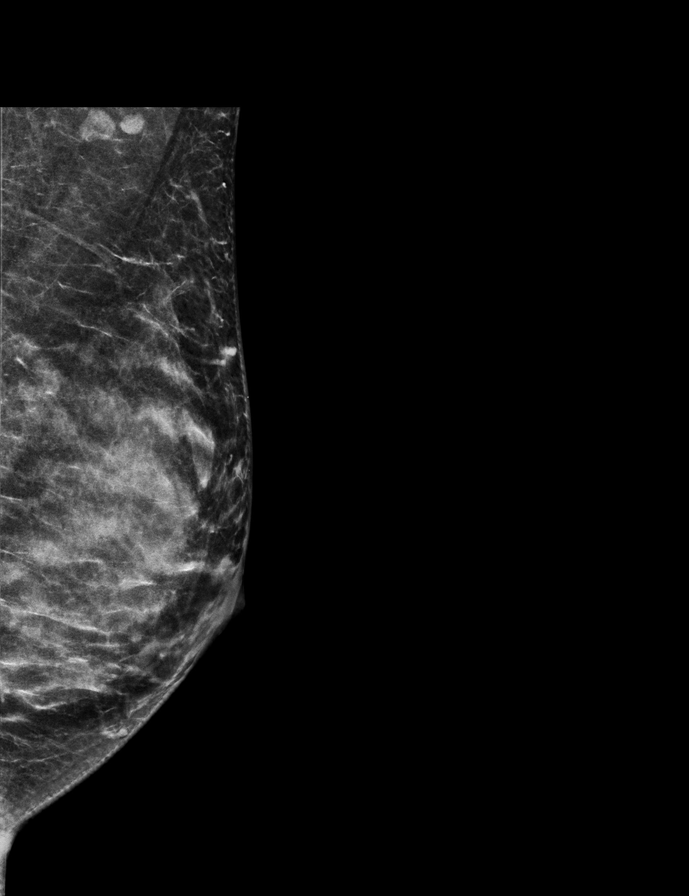

[L MLO tomo · 2 of 55 frames shown]
[frame 18/55]
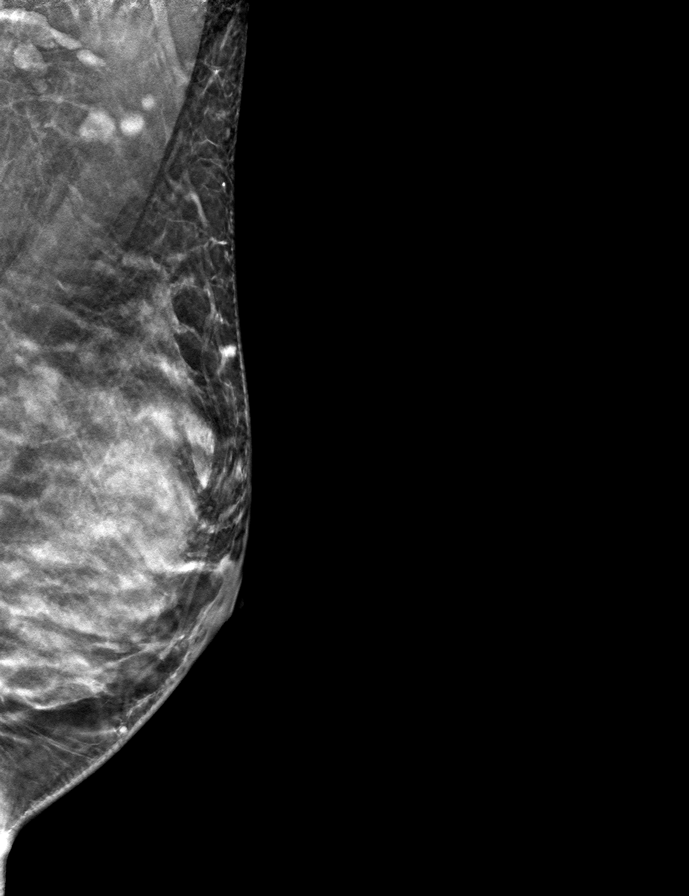
[frame 28/55]
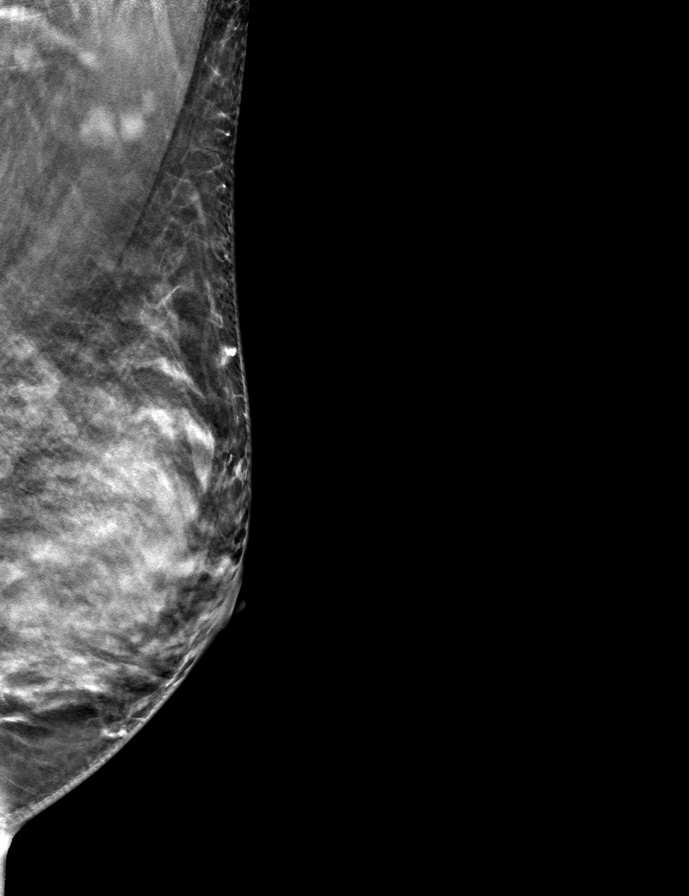

[R CC tomo · tomo slice 31/61.0]
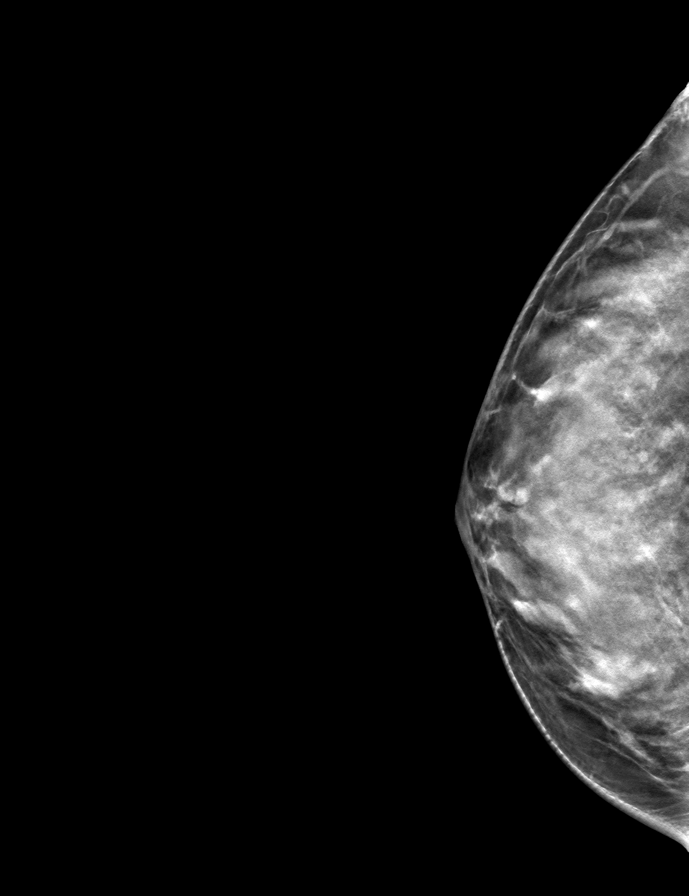

[R MLO tomo · tomo slice 29/57.0]
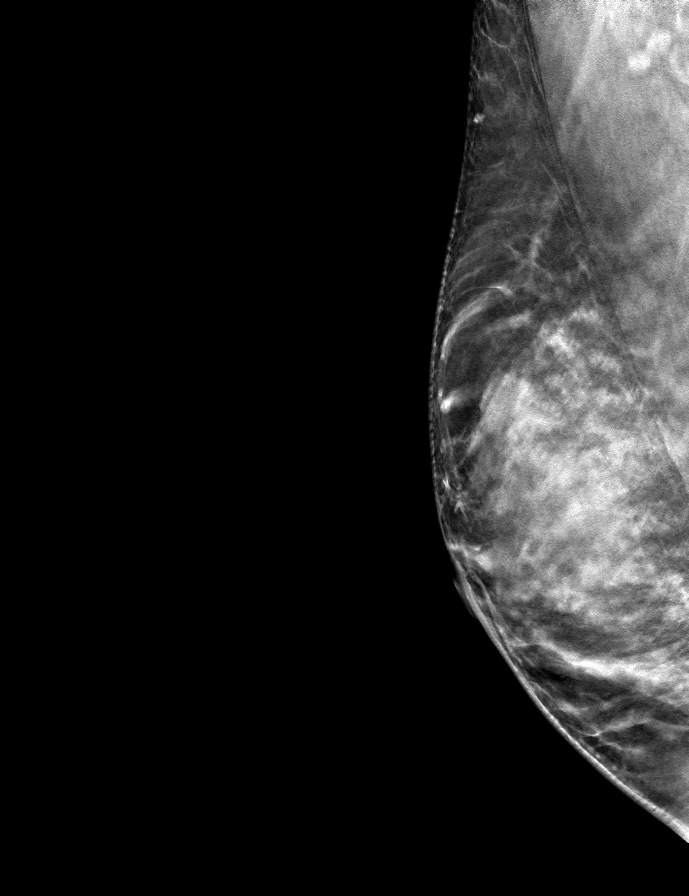

[L CC tomo · tomo slice 29/58.0]
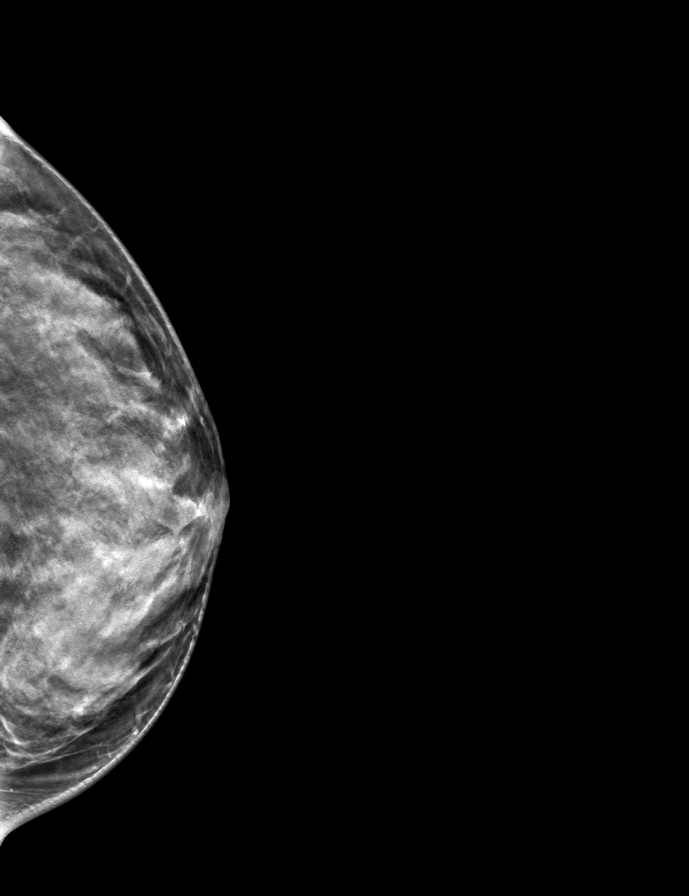

[9 of 24 positions shown; findings below may reference images not displayed]

ACR Breast Density Category d: The breast tissue is extremely dense,
which lowers the sensitivity of mammography
FINDINGS: There are no findings suspicious for malignancy. Images were
processed with CAD.
IMPRESSION: No mammographic evidence of malignancy. A result letter of this
screening mammogram will be mailed directly to the patient.

RECOMMENDATION:
Screening mammogram in one year. (Code:[id number])

BI-RADS CATEGORY  1: Negative.

## 2019-11-09 ENCOUNTER — Encounter: Payer: Self-pay | Admitting: Family Medicine

## 2019-11-11 ENCOUNTER — Other Ambulatory Visit: Payer: Self-pay

## 2019-11-11 ENCOUNTER — Ambulatory Visit
Admission: RE | Admit: 2019-11-11 | Discharge: 2019-11-11 | Disposition: A | Discharge status: Home / Self Care | Payer: 59 | Source: Ambulatory Visit | Attending: Family Medicine | Admitting: Family Medicine

## 2019-11-11 DIAGNOSIS — R590 Localized enlarged lymph nodes: Secondary | ICD-10-CM | POA: Diagnosis not present

## 2019-11-11 DIAGNOSIS — R599 Enlarged lymph nodes, unspecified: Secondary | ICD-10-CM

## 2019-11-26 ENCOUNTER — Telehealth: Payer: 59 | Admitting: Emergency Medicine

## 2019-11-26 DIAGNOSIS — N898 Other specified noninflammatory disorders of vagina: Secondary | ICD-10-CM

## 2019-11-26 MED ORDER — FLUCONAZOLE 150 MG PO TABS: 150.0000 mg | ORAL_TABLET | Freq: Once | ORAL | 0 refills | Status: AC

## 2019-11-26 MED FILL — FLUCONAZOLE 150 MG TABLET: 150 | 1 days supply | Qty: 1 | Fill #0

## 2019-11-26 NOTE — Progress Notes (Signed)
We are sorry that you are not feeling well. Here is how we plan to help! Based on what you shared with me it looks like you: May have a yeast vaginosis  Vaginosis is an inflammation of the vagina that can result in discharge, itching and pain. The cause is usually a change in the normal balance of vaginal bacteria or an infection. Vaginosis can also result from reduced estrogen levels after menopause.  The most common causes of vaginosis are:   Bacterial vaginosis which results from an overgrowth of one on several organisms that are normally present in your vagina.   Yeast infections which are caused by a naturally occurring fungus called candida.   Vaginal atrophy (atrophic vaginosis) which results from the thinning of the vagina from reduced estrogen levels after menopause.   Trichomoniasis which is caused by a parasite and is commonly transmitted by sexual intercourse.  Factors that increase your risk of developing vaginosis include: . Medications, such as antibiotics and steroids . Uncontrolled diabetes . Use of hygiene products such as bubble bath, vaginal spray or vaginal deodorant . Douching . Wearing damp or tight-fitting clothing . Using an intrauterine device (IUD) for birth control . Hormonal changes, such as those associated with pregnancy, birth control pills or menopause . Sexual activity . Having a sexually transmitted infection  Your treatment plan is A single Diflucan (fluconazole) 150mg tablet once.  I have electronically sent this prescription into the pharmacy that you have chosen.  Be sure to take all of the medication as directed. Stop taking any medication if you develop a rash, tongue swelling or shortness of breath. Mothers who are breast feeding should consider pumping and discarding their breast milk while on these antibiotics. However, there is no consensus that infant exposure at these doses would be harmful.  Remember that medication creams can weaken latex  condoms. .   HOME CARE:  Good hygiene may prevent some types of vaginosis from recurring and may relieve some symptoms:  . Avoid baths, hot tubs and whirlpool spas. Rinse soap from your outer genital area after a shower, and dry the area well to prevent irritation. Don't use scented or harsh soaps, such as those with deodorant or antibacterial action. . Avoid irritants. These include scented tampons and pads. . Wipe from front to back after using the toilet. Doing so avoids spreading fecal bacteria to your vagina.  Other things that may help prevent vaginosis include:  . Don't douche. Your vagina doesn't require cleansing other than normal bathing. Repetitive douching disrupts the normal organisms that reside in the vagina and can actually increase your risk of vaginal infection. Douching won't clear up a vaginal infection. . Use a latex condom. Both female and female latex condoms may help you avoid infections spread by sexual contact. . Wear cotton underwear. Also wear pantyhose with a cotton crotch. If you feel comfortable without it, skip wearing underwear to bed. Yeast thrives in moist environments Your symptoms should improve in the next day or two.  GET HELP RIGHT AWAY IF:  . You have pain in your lower abdomen ( pelvic area or over your ovaries) . You develop nausea or vomiting . You develop a fever . Your discharge changes or worsens . You have persistent pain with intercourse . You develop shortness of breath, a rapid pulse, or you faint.  These symptoms could be signs of problems or infections that need to be evaluated by a medical provider now.  MAKE SURE YOU      Understand these instructions.  Will watch your condition.  Will get help right away if you are not doing well or get worse.  Your e-visit answers were reviewed by a board certified advanced clinical practitioner to complete your personal care plan. Depending upon the condition, your plan could have included  both over the counter or prescription medications. Please review your pharmacy choice to make sure that you have choses a pharmacy that is open for you to pick up any needed prescription, Your safety is important to us. If you have drug allergies check your prescription carefully.   You can use MyChart to ask questions about today's visit, request a non-urgent call back, or ask for a work or school excuse for 24 hours related to this e-Visit. If it has been greater than 24 hours you will need to follow up with your provider, or enter a new e-Visit to address those concerns. You will get a MyChart message within the next two days asking about your experience. I hope that your e-visit has been valuable and will speed your recovery.  Approximately 5 minutes was used in reviewing the patient's chart, questionnaire, prescribing medications, and documentation.  

## 2019-12-02 ENCOUNTER — Telehealth: Payer: 59 | Admitting: Nurse Practitioner

## 2019-12-02 DIAGNOSIS — N3001 Acute cystitis with hematuria: Secondary | ICD-10-CM | POA: Diagnosis not present

## 2019-12-02 MED ORDER — SULFAMETHOXAZOLE-TRIMETHOPRIM 800-160 MG PO TABS: 1.0000 | ORAL_TABLET | Freq: Two times a day (BID) | ORAL | 0 refills | Status: DC

## 2019-12-02 MED FILL — SULFAMETHOXAZOLE-TMP DS TAB: 800-160 | 5 days supply | Qty: 10 | Fill #0

## 2019-12-02 NOTE — Progress Notes (Signed)

## 2020-02-17 ENCOUNTER — Other Ambulatory Visit (HOSPITAL_COMMUNITY): Payer: Self-pay | Admitting: Dermatology

## 2020-02-17 DIAGNOSIS — I872 Venous insufficiency (chronic) (peripheral): Secondary | ICD-10-CM | POA: Diagnosis not present

## 2020-02-18 MED FILL — BETAMETHASONE DP 0.05% CRM: 0.05 | 20 days supply | Qty: 45 | Fill #0

## 2020-03-23 ENCOUNTER — Telehealth: Payer: 59 | Admitting: Nurse Practitioner

## 2020-03-23 DIAGNOSIS — N3000 Acute cystitis without hematuria: Secondary | ICD-10-CM | POA: Diagnosis not present

## 2020-03-23 MED ORDER — CEPHALEXIN 500 MG PO CAPS: 500.0000 mg | ORAL_CAPSULE | Freq: Two times a day (BID) | ORAL | 0 refills | Status: DC

## 2020-03-23 MED FILL — CEPHALEXIN 500 MG CAPSULE: 500 | 7 days supply | Qty: 14 | Fill #0

## 2020-03-23 NOTE — Progress Notes (Signed)

## 2020-05-13 ENCOUNTER — Other Ambulatory Visit: Payer: Self-pay | Admitting: Physician Assistant

## 2020-05-13 ENCOUNTER — Telehealth: Payer: 59 | Admitting: Physician Assistant

## 2020-05-13 DIAGNOSIS — R300 Dysuria: Secondary | ICD-10-CM

## 2020-05-13 MED ORDER — CEPHALEXIN 500 MG PO CAPS: 500.0000 mg | ORAL_CAPSULE | Freq: Two times a day (BID) | ORAL | 0 refills | Status: DC

## 2020-05-13 MED FILL — CEPHALEXIN 500 MG CAPSULE: 500 | 7 days supply | Qty: 14 | Fill #0

## 2020-05-13 NOTE — Progress Notes (Signed)

## 2020-06-14 ENCOUNTER — Telehealth: Payer: 59 | Admitting: Nurse Practitioner

## 2020-06-14 ENCOUNTER — Other Ambulatory Visit: Payer: Self-pay | Admitting: Nurse Practitioner

## 2020-06-14 DIAGNOSIS — N3000 Acute cystitis without hematuria: Secondary | ICD-10-CM | POA: Diagnosis not present

## 2020-06-14 MED ORDER — CEPHALEXIN 500 MG PO CAPS: 500.0000 mg | ORAL_CAPSULE | Freq: Two times a day (BID) | ORAL | 0 refills | Status: DC

## 2020-06-14 MED FILL — CEPHALEXIN 500 MG CAPSULE: 500 | 7 days supply | Qty: 14 | Fill #0

## 2020-06-14 NOTE — Progress Notes (Signed)
We are sorry that you are not feeling well.  Here is how we plan to help!  Based on what you shared with me it looks like you most likely have a simple urinary tract infection.  A UTI (Urinary Tract Infection) is a bacterial infection of the bladder.  Most cases of urinary tract infections are simple to treat but a key part of your care is to encourage you to drink plenty of fluids and watch your symptoms carefully. According to ypur chat this appears to be a recurrent UTI. You were just treated for UTI  A little over a month ago. If reoccurs after this treatment, then you will need a face to face visit.  I have prescribed Keflex 500 mg twice a day for 7 days.  Your symptoms should gradually improve. Call us if the burning in your urine worsens, you develop worsening fever, back pain or pelvic pain or if your symptoms do not resolve after completing the antibiotic.  Urinary tract infections can be prevented by drinking plenty of water to keep your body hydrated.  Also be sure when you wipe, wipe from front to back and don't hold it in!  If possible, empty your bladder every 4 hours.  Your e-visit answers were reviewed by a board certified advanced clinical practitioner to complete your personal care plan.  Depending on the condition, your plan could have included both over the counter or prescription medications.  If there is a problem please reply  once you have received a response from your provider.  Your safety is important to [place].  If you have drug allergies check your prescription carefully.    You can use MyChart to ask questions about today's visit, request a non-urgent call back, or ask for a work or school excuse for 24 hours related to this e-Visit. If it has been greater than 24 hours you will need to follow up with your provider, or enter a new e-Visit to address those concerns.   You will get an e-mail in the next two days asking about your experience.  I hope that your e-visit has  been valuable and will speed your recovery. Thank you for using e-visits.   5-10 minutes spent reviewing and documenting in chart.

## 2020-06-17 MED FILL — BETAMETHASONE DP 0.05% CRM: 0.05 | 20 days supply | Qty: 45 | Fill #1

## 2020-06-29 ENCOUNTER — Telehealth: Payer: 59 | Admitting: Emergency Medicine

## 2020-06-29 ENCOUNTER — Other Ambulatory Visit: Payer: Self-pay | Admitting: Emergency Medicine

## 2020-06-29 DIAGNOSIS — N760 Acute vaginitis: Secondary | ICD-10-CM | POA: Diagnosis not present

## 2020-06-29 MED ORDER — FLUCONAZOLE 150 MG PO TABS: 150.0000 mg | ORAL_TABLET | Freq: Once | ORAL | 0 refills | Status: DC

## 2020-06-29 MED FILL — FLUCONAZOLE 150 MG TABS: 150 | 1 days supply | Qty: 1 | Fill #0

## 2020-06-29 NOTE — Progress Notes (Signed)
We are sorry that you are not feeling well. Here is how we plan to help! Based on what you shared with me it looks like you: May have a yeast vaginosis  Vaginosis is an inflammation of the vagina that can result in discharge, itching and pain. The cause is usually a change in the normal balance of vaginal bacteria or an infection. Vaginosis can also result from reduced estrogen levels after menopause.  The most common causes of vaginosis are:   Bacterial vaginosis which results from an overgrowth of one on several organisms that are normally present in your vagina.   Yeast infections which are caused by a naturally occurring fungus called candida.   Vaginal atrophy (atrophic vaginosis) which results from the thinning of the vagina from reduced estrogen levels after menopause.   Trichomoniasis which is caused by a parasite and is commonly transmitted by sexual intercourse.  Factors that increase your risk of developing vaginosis include: . Medications, such as antibiotics and steroids . Uncontrolled diabetes . Use of hygiene products such as bubble bath, vaginal spray or vaginal deodorant . Douching . Wearing damp or tight-fitting clothing . Using an intrauterine device (IUD) for birth control . Hormonal changes, such as those associated with pregnancy, birth control pills or menopause . Sexual activity . Having a sexually transmitted infection  Your treatment plan is A single Diflucan (fluconazole) 150mg tablet once.  I have electronically sent this prescription into the pharmacy that you have chosen.  Be sure to take all of the medication as directed. Stop taking any medication if you develop a rash, tongue swelling or shortness of breath. Mothers who are breast feeding should consider pumping and discarding their breast milk while on these antibiotics. However, there is no consensus that infant exposure at these doses would be harmful.  Remember that medication creams can weaken latex  condoms. .   HOME CARE:  Good hygiene may prevent some types of vaginosis from recurring and may relieve some symptoms:  . Avoid baths, hot tubs and whirlpool spas. Rinse soap from your outer genital area after a shower, and dry the area well to prevent irritation. Don't use scented or harsh soaps, such as those with deodorant or antibacterial action. . Avoid irritants. These include scented tampons and pads. . Wipe from front to back after using the toilet. Doing so avoids spreading fecal bacteria to your vagina.  Other things that may help prevent vaginosis include:  . Don't douche. Your vagina doesn't require cleansing other than normal bathing. Repetitive douching disrupts the normal organisms that reside in the vagina and can actually increase your risk of vaginal infection. Douching won't clear up a vaginal infection. . Use a latex condom. Both female and female latex condoms may help you avoid infections spread by sexual contact. . Wear cotton underwear. Also wear pantyhose with a cotton crotch. If you feel comfortable without it, skip wearing underwear to bed. Yeast thrives in moist environments Your symptoms should improve in the next day or two.  GET HELP RIGHT AWAY IF:  . You have pain in your lower abdomen ( pelvic area or over your ovaries) . You develop nausea or vomiting . You develop a fever . Your discharge changes or worsens . You have persistent pain with intercourse . You develop shortness of breath, a rapid pulse, or you faint.  These symptoms could be signs of problems or infections that need to be evaluated by a medical provider now.  MAKE SURE YOU      Understand these instructions.  Will watch your condition.  Will get help right away if you are not doing well or get worse.  Your e-visit answers were reviewed by a board certified advanced clinical practitioner to complete your personal care plan. Depending upon the condition, your plan could have included  both over the counter or prescription medications. Please review your pharmacy choice to make sure that you have choses a pharmacy that is open for you to pick up any needed prescription, Your safety is important to us. If you have drug allergies check your prescription carefully.   You can use MyChart to ask questions about today's visit, request a non-urgent call back, or ask for a work or school excuse for 24 hours related to this e-Visit. If it has been greater than 24 hours you will need to follow up with your provider, or enter a new e-Visit to address those concerns. You will get a MyChart message within the next two days asking about your experience. I hope that your e-visit has been valuable and will speed your recovery.  Approximately 5 minutes was used in reviewing the patient's chart, questionnaire, prescribing medications, and documentation.  

## 2020-06-29 NOTE — Addendum Note (Signed)
Addended by: Roxy Horseman B on: 06/29/2020 11:13 AM   Modules accepted: Orders

## 2020-07-11 ENCOUNTER — Other Ambulatory Visit: Payer: Self-pay | Admitting: Family Medicine

## 2020-07-11 DIAGNOSIS — Z1231 Encounter for screening mammogram for malignant neoplasm of breast: Secondary | ICD-10-CM

## 2020-07-13 ENCOUNTER — Encounter: Payer: Self-pay | Admitting: Family Medicine

## 2020-07-13 ENCOUNTER — Other Ambulatory Visit: Payer: Self-pay | Admitting: Family Medicine

## 2020-07-13 ENCOUNTER — Other Ambulatory Visit: Payer: Self-pay

## 2020-07-13 ENCOUNTER — Ambulatory Visit: Payer: 59 | Admitting: Family Medicine

## 2020-07-13 VITALS — BP 112/70 | HR 64 | Temp 97.4°F | Resp 14 | Ht 64.0 in | Wt 160.0 lb

## 2020-07-13 DIAGNOSIS — L02419 Cutaneous abscess of limb, unspecified: Secondary | ICD-10-CM

## 2020-07-13 MED ORDER — FLUCONAZOLE 150 MG PO TABS: ORAL_TABLET | ORAL | 0 refills | Status: DC

## 2020-07-13 MED ORDER — SULFAMETHOXAZOLE-TRIMETHOPRIM 800-160 MG PO TABS: 1.0000 | ORAL_TABLET | Freq: Two times a day (BID) | ORAL | 0 refills | Status: DC

## 2020-07-13 MED FILL — SULFAMETHOXAZOLE-TMP DS TAB: 800-160 | 7 days supply | Qty: 14 | Fill #0

## 2020-07-13 MED FILL — FLUCONAZOLE 150 MG TABS: 150 | 3 days supply | Qty: 2 | Fill #0

## 2020-07-13 NOTE — Progress Notes (Signed)
   Subjective:    Patient ID: Jennifer Hansen, female    DOB: January 17, 1976, 45 y.o.   MRN: [id number]  Patient presents for Abscess Inner Thigh (X3 days- not draining)   Last Friday started with absess in left groin, she has had abscess in the same area before.  On Monday it was quite large.  She never had any drainage but was quite tender.  She did try warm compress but with a difficult area to keep it on.  She has not had any fever chills myalgias.  No other lesions on the skin.  She has been on antibiotics intermittently for the past few months for urinary tract infections as well as vaginitis subsequently thereafter.  Her last 2 courses she was treated with Keflex.  Her visits have been through the [place] secondary to COVID-19 pandemic  Review Of Systems:  GEN- denies fatigue, fever, weight loss,weakness, recent illness HEENT- denies eye drainage, change in vision, nasal discharge, CVS- denies chest pain, palpitations RESP- denies SOB, cough, wheeze ABD- denies N/V, change in stools, abd pain GU- denies dysuria, hematuria, dribbling, incontinence Neuro- denies headache, dizziness, syncope, seizure activity       Objective:    BP 112/70   Pulse 64   Temp (!) 97.4 F (36.3 C) (Temporal)   Resp 14   Ht 5\' 4"  (1.626 m)   Wt 160 lb (72.6 kg)   SpO2 100%   BMI 27.46 kg/m  GEN- NAD, alert and oriented x3 CVS- RRR, no murmur RESP-CTAB Skin 2 to 3 cm abscess left inner thigh positive fluctuance mild tenderness to palpation no erythema no warmth no drainage EXT- No edema Pulses- Radial 2+  Procedure- Incision and Drainage Procedure explained to patient questions answered benefits and risks discussed  verbal consent obtained. Antiseptic-Betadine Anesthesia-lidocaine 1% with Epi 2 small Incisions performed blood expressed no pus, no odor  Minimal blood loss Patient tolerated procedure well Bandage applied      Assessment & Plan:      Problem List Items Addressed This Visit    None   Visit Diagnoses    Abscess of leg    -  Primary   recurrent abscess in leg only blood expressed, unable to culture. start bactrim, sitz bath/ compress, discussed wound care  History  of yeast infections with antibiotics I have given her Diflucan she will take 1 and repeat in 3 days if she has symptoms.  For prophylaxis I recommend that she incorporate yogurt with probiotics or take a probiotic on its own.  Also for urinary tract recommend she take cranberry tablets daily.  If she does get recurrent UTI symptoms she can come to the office and leave a urine sample so we can send for culture to see if she has resistant bacteria      Note: This dictation was prepared with Dragon dictation along with smaller phrase technology. Any transcriptional errors that result from this process are unintentional.

## 2020-07-13 NOTE — Patient Instructions (Signed)
Take Bactrim twice a day  Take diflucan at end of antibotics, repeat in 3 days if symptomatic  Cranberry tablets once a day for UTI prophylaxis  Take probiotics with Acidophilus/ Laactobacillus to help with recurrent yeast infections If you get UTI symptoms, come by office to leave urine sample  F/U as needed

## 2020-08-15 ENCOUNTER — Ambulatory Visit
Admission: RE | Admit: 2020-08-15 | Discharge: 2020-08-15 | Disposition: A | Discharge status: Home / Self Care | Payer: 59 | Source: Ambulatory Visit | Attending: Family Medicine | Admitting: Family Medicine

## 2020-08-15 ENCOUNTER — Other Ambulatory Visit: Payer: Self-pay

## 2020-08-15 ENCOUNTER — Encounter: Payer: Self-pay | Admitting: Family Medicine

## 2020-08-15 DIAGNOSIS — Z1231 Encounter for screening mammogram for malignant neoplasm of breast: Secondary | ICD-10-CM | POA: Diagnosis not present

## 2020-08-17 ENCOUNTER — Telehealth: Payer: 59 | Admitting: Family

## 2020-08-17 ENCOUNTER — Other Ambulatory Visit: Payer: Self-pay | Admitting: Family

## 2020-08-17 DIAGNOSIS — R399 Unspecified symptoms and signs involving the genitourinary system: Secondary | ICD-10-CM

## 2020-08-17 MED ORDER — CEPHALEXIN 500 MG PO CAPS: 500.0000 mg | ORAL_CAPSULE | Freq: Two times a day (BID) | ORAL | 0 refills | Status: DC

## 2020-08-17 MED FILL — CEPHALEXIN 500 MG CAPSULE: 500 | 7 days supply | Qty: 14 | Fill #0

## 2020-08-17 NOTE — Progress Notes (Signed)

## 2020-08-19 ENCOUNTER — Other Ambulatory Visit: Payer: Self-pay | Admitting: Family Medicine

## 2020-08-19 ENCOUNTER — Ambulatory Visit: Payer: 59

## 2020-08-19 ENCOUNTER — Ambulatory Visit: Payer: 59 | Admitting: Family Medicine

## 2020-08-19 ENCOUNTER — Other Ambulatory Visit: Payer: Self-pay

## 2020-08-19 ENCOUNTER — Encounter: Payer: Self-pay | Admitting: Family Medicine

## 2020-08-19 VITALS — BP 132/80 | HR 68 | Temp 97.9°F | Resp 14 | Ht 64.0 in | Wt 163.0 lb

## 2020-08-19 DIAGNOSIS — R300 Dysuria: Secondary | ICD-10-CM

## 2020-08-19 LAB — URINALYSIS, ROUTINE W REFLEX MICROSCOPIC
Bacteria, UA: NONE SEEN /HPF
Bilirubin Urine: NEGATIVE
Glucose, UA: NEGATIVE
Hyaline Cast: NONE SEEN /LPF
Ketones, ur: NEGATIVE
Leukocytes,Ua: NEGATIVE
Nitrite: NEGATIVE
Protein, ur: NEGATIVE
Specific Gravity, Urine: 1.02 (ref 1.001–1.03)
WBC, UA: NONE SEEN /HPF (ref 0–5)
pH: 7 (ref 5.0–8.0)

## 2020-08-19 LAB — MICROSCOPIC MESSAGE

## 2020-08-19 MED ORDER — CEPHALEXIN 500 MG PO CAPS: 500.0000 mg | ORAL_CAPSULE | Freq: Once | ORAL | 0 refills | Status: DC | PRN

## 2020-08-19 NOTE — Progress Notes (Signed)
Subjective:    Patient ID: Jennifer Hansen, female    DOB: 08/07/1975, 45 y.o.   MRN: [id number]  HPI Patient is here today for urinary tract infection.  She is a very pleasant 45 year old African-American female who states that recently over the last year or so she is been having very frequent UTIs.  She describes it as a pressure-like weight sensation in her lower pelvis.  She will report increased frequency and urgency.  However she will also have dysuria.  She also states that she feels like she has to go to the bathroom however once she sits down and tries to relieve herself, there will be no urine that comes out.  As soon as she stands up she feels like she has to urinate again.  Her urinary tract infections frequently occur after her periods or after sexual activity.  She recently had symptoms about 4 days ago however she performed an ED visit and was started on Keflex.  She states that she is taken 1-1/2 days of Keflex.  Her symptoms are much better now.  Urinalysis shows small amount of blood but is otherwise negative Past Medical History:  Diagnosis Date   Acne    Past Surgical History:  Procedure Laterality Date   BUNIONECTOMY     TUBAL LIGATION  2004   Current Outpatient Medications on File Prior to Visit  Medication Sig Dispense Refill   ascorbic acid (VITAMIN C) 500 MG tablet Take 500 mg by mouth daily.     Biotin 5 MG CAPS Take by mouth.     cephALEXin (KEFLEX) 500 MG capsule Take 1 capsule (500 mg total) by mouth 2 (two) times daily. 14 capsule 0   Cholecalciferol (VITAMIN D) 125 MCG (5000 UT) CAPS Take by mouth.     fluconazole (DIFLUCAN) 150 MG tablet Take 1 tablet and repeat in 3 days 2 tablet 0   Fluocinolone Acetonide Body (DERMA-SMOOTHE/FS BODY) 0.01 % OIL Apply to affected ares on scalp once a day as needed 120 mL 2   ketoconazole (NIZORAL) 2 % shampoo Apply 1 application topically 2 (two) times a week. 120 mL 1   Multiple Vitamin (MULTIVITAMIN WITH  MINERALS) TABS tablet Take 1 tablet by mouth daily.     sulfamethoxazole-trimethoprim (BACTRIM DS) 800-160 MG tablet Take 1 tablet by mouth 2 (two) times daily. 14 tablet 0   triamcinolone cream (KENALOG) 0.1 % Apply 1 application topically 2 (two) times daily. (Patient taking differently: Apply 1 application topically as needed.) 30 g 1   Zinc 30 MG CAPS Take by mouth.     No current facility-administered medications on file prior to visit.   No Known Allergies Social History   Socioeconomic History   Marital status: Widowed    Spouse name: Not on file   Number of children: Not on file   Years of education: Not on file   Highest education level: Not on file  Occupational History   Not on file  Tobacco Use   Smoking status: Never Smoker   Smokeless tobacco: Never Used  Vaping Use   Vaping Use: Never used  Substance and Sexual Activity   Alcohol use: No   Drug use: No   Sexual activity: Not Currently    Birth control/protection: Surgical    Comment: tubal  Other Topics Concern   Not on file  Social History Narrative   Not on file   Social Determinants of Health   Financial Resource Strain: Not on file  Food Insecurity: Not on file  Transportation Needs: Not on file  Physical Activity: Not on file  Stress: Not on file  Social Connections: Not on file  Intimate Partner Violence: Not on file      Review of Systems  All other systems reviewed and are negative.      Objective:   Physical Exam Vitals reviewed.  Constitutional:      General: She is not in acute distress.    Appearance: Normal appearance. She is not ill-appearing or toxic-appearing.  Cardiovascular:     Rate and Rhythm: Normal rate and regular rhythm.     Heart sounds: Normal heart sounds.  Pulmonary:     Effort: Pulmonary effort is normal. No respiratory distress.     Breath sounds: Normal breath sounds. No wheezing, rhonchi or rales.  Abdominal:     General: Abdomen is flat.  There is no distension.     Palpations: Abdomen is soft.     Tenderness: There is no abdominal tenderness.  Neurological:     Mental Status: She is alert.           Assessment & Plan:  Dysuria - Plan: Urine Culture  Regarding her current urinary tract infection, it is difficult to say whether she had a bladder infection due to the urinalysis being obtained while she is on antibiotics however her symptoms certainly sound like 1 so I recommended that she complete at least 3 days of Keflex.  I will send a urine culture.  Regarding the increased frequency of urinary tract infections, we discussed strategies to help prevent them.  If this continues, I would recommend using Keflex 500 mg capsule 1 time after sex or 1 time after her menstrual cycle.

## 2020-08-19 NOTE — Addendum Note (Signed)
Addended by: Sheral Flow on: 08/19/2020 04:11 PM   Modules accepted: Orders

## 2020-08-20 LAB — URINE CULTURE
MICRO NUMBER:: 11552425
Result:: NO GROWTH
SPECIMEN QUALITY:: ADEQUATE

## 2020-08-22 MED FILL — CEPHALEXIN 500 MG CAPSULE: 500 | 30 days supply | Qty: 30 | Fill #0

## 2020-08-25 DIAGNOSIS — Z0100 Encounter for examination of eyes and vision without abnormal findings: Secondary | ICD-10-CM | POA: Diagnosis not present

## 2020-10-11 ENCOUNTER — Encounter: Payer: 59 | Admitting: Family Medicine

## 2020-10-11 ENCOUNTER — Ambulatory Visit: Payer: 59 | Admitting: Nurse Practitioner

## 2020-10-11 ENCOUNTER — Encounter: Payer: Self-pay | Admitting: Nurse Practitioner

## 2020-10-11 ENCOUNTER — Other Ambulatory Visit: Payer: Self-pay

## 2020-10-11 VITALS — BP 118/80 | HR 84 | Temp 98.6°F | Ht 64.0 in | Wt 161.6 lb

## 2020-10-11 DIAGNOSIS — Z124 Encounter for screening for malignant neoplasm of cervix: Secondary | ICD-10-CM | POA: Diagnosis not present

## 2020-10-11 DIAGNOSIS — Z0001 Encounter for general adult medical examination with abnormal findings: Secondary | ICD-10-CM | POA: Diagnosis not present

## 2020-10-11 DIAGNOSIS — Z1159 Encounter for screening for other viral diseases: Secondary | ICD-10-CM | POA: Diagnosis not present

## 2020-10-11 DIAGNOSIS — Z13220 Encounter for screening for lipoid disorders: Secondary | ICD-10-CM

## 2020-10-11 DIAGNOSIS — Z131 Encounter for screening for diabetes mellitus: Secondary | ICD-10-CM

## 2020-10-11 DIAGNOSIS — Z136 Encounter for screening for cardiovascular disorders: Secondary | ICD-10-CM | POA: Diagnosis not present

## 2020-10-11 DIAGNOSIS — D367 Benign neoplasm of other specified sites: Secondary | ICD-10-CM | POA: Diagnosis not present

## 2020-10-11 DIAGNOSIS — E559 Vitamin D deficiency, unspecified: Secondary | ICD-10-CM

## 2020-10-11 DIAGNOSIS — Z1329 Encounter for screening for other suspected endocrine disorder: Secondary | ICD-10-CM

## 2020-10-11 DIAGNOSIS — Z0000 Encounter for general adult medical examination without abnormal findings: Secondary | ICD-10-CM | POA: Diagnosis not present

## 2020-10-11 DIAGNOSIS — Z1211 Encounter for screening for malignant neoplasm of colon: Secondary | ICD-10-CM

## 2020-10-11 NOTE — Progress Notes (Signed)
BP 118/80   Pulse 84   Temp 98.6 F (37 C)   Ht 5\' 4"  (1.626 m)   Wt 161 lb 9.6 oz (73.3 kg)   LMP 09/23/2020   SpO2 99%   BMI 27.74 kg/m    Subjective:    Patient ID: Jennifer Hansen, female    DOB: July 19, 1975, 45 y.o.   MRN: [id number]  HPI: Jennifer Hansen is a 45 y.o. female presenting on 10/11/2020 for comprehensive medical examination. Current medical complaints include:  SKIN INFECTION Duration: years Location: left thigh History of trauma in area: no Pain: yes;  Quality: aching Severity:  Redness: no Swelling: no Oozing: no Pus: no Fevers: no Nausea/vomiting: no Status: not changing Treatments attempted: home remedies, epsom salts  She currently lives with: daugther LMP: no  Depression Screen done today and results listed below:  Depression screen [facility] 2/9 10/11/2020 10/09/2019 06/23/2018 06/20/2017 02/15/2017  Decreased Interest 0 0 0 0 0  Down, Depressed, Hopeless 0 0 0 0 0  PHQ - 2 Score 0 0 0 0 0  Altered sleeping - - - 0 -  Tired, decreased energy - - - 0 -  Change in appetite - - - 0 -  Feeling bad or failure about yourself  - - - 0 -  Trouble concentrating - - - 0 -  Moving slowly or fidgety/restless - - - 0 -  Suicidal thoughts - - - 0 -  PHQ-9 Score - - - 0 -    The patient does not have a history of falls. I did not complete a risk assessment for falls. A plan of care for falls was not documented.   Past Medical History:  Past Medical History:  Diagnosis Date  . Acne     Surgical History:  Past Surgical History:  Procedure Laterality Date  . BUNIONECTOMY    . TUBAL LIGATION  2004    Medications:  Current Outpatient Medications on File Prior to Visit  Medication Sig  . ascorbic acid (VITAMIN C) 500 MG tablet Take 500 mg by mouth daily.  . betamethasone dipropionate 0.05 % cream APPLY TO AFFECTED AREA UP TO TWICE DAILY AS NEEDED. (NOT ON FACE, GROIN, AND UNDERARMS).  . Biotin 5 MG CAPS Take by mouth.  . Cholecalciferol (VITAMIN D)  125 MCG (5000 UT) CAPS Take by mouth.  . Fluocinolone Acetonide Body (DERMA-SMOOTHE/FS BODY) 0.01 % OIL Apply to affected ares on scalp once a day as needed  . ketoconazole (NIZORAL) 2 % shampoo Apply 1 application topically 2 (two) times a week.  . Multiple Vitamin (MULTIVITAMIN WITH MINERALS) TABS tablet Take 1 tablet by mouth daily.  Marland Kitchen triamcinolone cream (KENALOG) 0.1 % Apply 1 application topically 2 (two) times daily. (Patient taking differently: Apply 1 application topically as needed.)  . Zinc 30 MG CAPS Take by mouth.   No current facility-administered medications on file prior to visit.    Allergies:  No Known Allergies  Social History:  Social History   Socioeconomic History  . Marital status: Widowed    Spouse name: Not on file  . Number of children: Not on file  . Years of education: Not on file  . Highest education level: Not on file  Occupational History  . Not on file  Tobacco Use  . Smoking status: Never Smoker  . Smokeless tobacco: Never Used  Vaping Use  . Vaping Use: Never used  Substance and Sexual Activity  . Alcohol use: No  . Drug  use: No  . Sexual activity: Not Currently    Birth control/protection: Surgical    Comment: tubal  Other Topics Concern  . Not on file  Social History Narrative  . Not on file   Social Determinants of Health   Financial Resource Strain: Not on file  Food Insecurity: Not on file  Transportation Needs: Not on file  Physical Activity: Not on file  Stress: Not on file  Social Connections: Not on file  Intimate Partner Violence: Not on file   Social History   Tobacco Use  Smoking Status Never Smoker  Smokeless Tobacco Never Used   Social History   Substance and Sexual Activity  Alcohol Use No    Family History:  Family History  Problem Relation Age of Onset  . Cancer Maternal Grandmother        colon cancer  . Diabetes Maternal Grandmother   . Heart disease Maternal Grandmother   . Diabetes Father   .  Arthritis Father   . Aneurysm Paternal Grandfather   . Cancer Maternal Grandfather   . Arthritis Mother     Past medical history, surgical history, medications, allergies, family history and social history reviewed with patient today and changes made to appropriate areas of the chart.   Review of Systems  Constitutional: Negative.   HENT: Negative.   Eyes: Negative.   Respiratory: Negative.   Cardiovascular: Negative.   Gastrointestinal: Negative.   Genitourinary: Negative.   Musculoskeletal: Negative.   Skin:       +boil left thigh  Neurological: Negative.   Psychiatric/Behavioral: Negative.        Objective:    BP 118/80   Pulse 84   Temp 98.6 F (37 C)   Ht 5\' 4"  (1.626 m)   Wt 161 lb 9.6 oz (73.3 kg)   LMP 09/23/2020   SpO2 99%   BMI 27.74 kg/m   Wt Readings from Last 3 Encounters:  10/11/20 161 lb 9.6 oz (73.3 kg)  08/19/20 163 lb (73.9 kg)  07/13/20 160 lb (72.6 kg)    Physical Exam Vitals and nursing note reviewed. Exam conducted with a chaperone present (AW).  Constitutional:      General: She is not in acute distress.    Appearance: Normal appearance. She is normal weight. She is not ill-appearing or toxic-appearing.  HENT:     Head: Normocephalic and atraumatic.     Right Ear: Tympanic membrane, ear canal and external ear normal.     Left Ear: Tympanic membrane, ear canal and external ear normal.     Nose: Nose normal. No congestion or rhinorrhea.     Mouth/Throat:     Mouth: Mucous membranes are moist.     Pharynx: Oropharynx is clear. No oropharyngeal exudate.  Eyes:     General: No scleral icterus.    Extraocular Movements: Extraocular movements intact.     Pupils: Pupils are equal, round, and reactive to light.  Cardiovascular:     Rate and Rhythm: Normal rate and regular rhythm.     Pulses: Normal pulses.     Heart sounds: Normal heart sounds. No murmur heard.   Pulmonary:     Effort: Pulmonary effort is normal. No respiratory distress.      Breath sounds: No wheezing or rhonchi.  Chest:  Breasts:     Right: Normal. No inverted nipple, mass, nipple discharge or skin change.     Left: Normal. No inverted nipple, mass, nipple discharge or skin change.  Abdominal:     General: Abdomen is flat. Bowel sounds are normal. There is no distension.     Palpations: Abdomen is soft.     Tenderness: There is no abdominal tenderness.  Genitourinary:    General: Normal vulva.     Exam position: Lithotomy position.     Pubic Area: No rash.      Labia:        Right: No rash, tenderness or lesion.        Left: No rash, tenderness or lesion.      Vagina: Normal. No vaginal discharge, erythema or tenderness.     Cervix: Normal.     Uterus: Normal. Not deviated and not enlarged.      Adnexa: Right adnexa normal and left adnexa normal.       Right: No mass or tenderness.         Left: No mass or tenderness.    Musculoskeletal:        General: No swelling or tenderness. Normal range of motion.     Cervical back: Normal range of motion and neck supple. No rigidity or tenderness.     Right lower leg: No edema.     Left lower leg: No edema.  Lymphadenopathy:     Lower Body: No right inguinal adenopathy. No left inguinal adenopathy.  Skin:    General: Skin is warm and dry.     Capillary Refill: Capillary refill takes less than 2 seconds.     Coloration: Skin is not jaundiced or pale.  Neurological:     General: No focal deficit present.     Mental Status: She is alert and oriented to person, place, and time.     Motor: No weakness.     Gait: Gait normal.  Psychiatric:        Mood and Affect: Mood normal.        Behavior: Behavior normal.        Thought Content: Thought content normal.        Judgment: Judgment normal.        Assessment & Plan:   Problem List Items Addressed This Visit      Other   Dermoid cyst of leg, left    Chronic.  Not currently infected, however still bothersome as it does flare up occasionally  requiring antibiotics.  Patient would like to discuss potential removal.  Will place referral to general surgery.      Relevant Orders   Ambulatory referral to General Surgery    Other Visit Diagnoses    Annual physical exam    -  Primary   Relevant Orders   COMPLETE METABOLIC PANEL WITH GFR   B12 and Folate Panel   CBC with Differential   Encounter for lipid screening for cardiovascular disease       Relevant Orders   Lipid panel   Need for hepatitis C screening test       Relevant Orders   Hepatitis C antibody   Cervical cancer screening       Relevant Orders   PAP,TP IMGw/HPV RNA,rflx IONGEXB28,41/32   Vitamin D deficiency       Relevant Orders   VITAMIN D 25 Hydroxy (Vit-D Deficiency, Fractures)   Screening for diabetes mellitus       Relevant Orders   Hemoglobin A1c   Screening for thyroid disorder       Relevant Orders   TSH       Follow up plan: Return in  about 1 year (around 10/11/2021) for CPE with fasting labs.   LABORATORY TESTING:  - Pap smear: pap done today - last PAP 10/2019 showed LSIL --> colposcopy was done and showed low grade dysplasia.  Dr. Elonda Husky recommended repeat HPV based cytology in 1 year.     IMMUNIZATIONS:   - Tdap: Tetanus vaccination status reviewed: last tetanus booster within 10 years. - Influenza: Up to date - Pneumovax: Not applicable - Prevnar: Not applicable - HPV: Not applicable - Zostavax vaccine: Not applicable - DHRCB-63 vaccine: Fully vaccinated with 3 Pfizer vaccines  SCREENING: -Mammogram: Up to date  - Colonoscopy: patient to contact insurance regarding coverage and will place referral if insurance covers  - Bone Density: Not applicable  -Hearing Test: Not applicable  -Spirometry: Not applicable   PATIENT COUNSELING:   Advised to take 1 mg of folate supplement per day if capable of pregnancy.   Sexuality: Discussed sexually transmitted diseases, partner selection, use of condoms, avoidance of unintended pregnancy   and contraceptive alternatives.   Advised to avoid cigarette smoking.  I discussed with the patient that most people either abstain from alcohol or drink within safe limits (<=14/week and <=4 drinks/occasion for males, <=7/weeks and <= 3 drinks/occasion for females) and that the risk for alcohol disorders and other health effects rises proportionally with the number of drinks per week and how often a drinker exceeds daily limits.  Discussed cessation/primary prevention of drug use and availability of treatment for abuse.   Diet: Encouraged to adjust caloric intake to maintain  or achieve ideal body weight, to reduce intake of dietary saturated fat and total fat, to limit sodium intake by avoiding high sodium foods and not adding table salt, and to maintain adequate dietary potassium and calcium preferably from fresh fruits, vegetables, and low-fat dairy products.    stressed the importance of regular exercise  Injury prevention: Discussed safety belts, safety helmets, smoke detector, smoking near bedding or upholstery.   Dental health: Discussed importance of regular tooth brushing, flossing, and dental visits.    NEXT PREVENTATIVE PHYSICAL DUE IN 1 YEAR. Return in about 1 year (around 10/11/2021) for CPE with fasting labs.

## 2020-10-11 NOTE — Assessment & Plan Note (Signed)
Chronic.  Not currently infected, however still bothersome as it does flare up occasionally requiring antibiotics.  Patient would like to discuss potential removal.  Will place referral to general surgery.

## 2020-10-12 ENCOUNTER — Encounter: Payer: Self-pay | Admitting: Nurse Practitioner

## 2020-10-12 DIAGNOSIS — R7303 Prediabetes: Secondary | ICD-10-CM | POA: Insufficient documentation

## 2020-10-12 LAB — CBC WITH DIFFERENTIAL/PLATELET
Absolute Monocytes: 778 cells/uL (ref 200–950)
Basophils Absolute: 32 cells/uL (ref 0–200)
Basophils Relative: 0.6 %
Eosinophils Absolute: 103 cells/uL (ref 15–500)
Eosinophils Relative: 1.9 %
HCT: 39.5 % (ref 35.0–45.0)
Hemoglobin: 12.9 g/dL (ref 11.7–15.5)
Lymphs Abs: 2111 cells/uL (ref 850–3900)
MCH: 30.9 pg (ref 27.0–33.0)
MCHC: 32.7 g/dL (ref 32.0–36.0)
MCV: 94.7 fL (ref 80.0–100.0)
MPV: 10.2 fL (ref 7.5–12.5)
Monocytes Relative: 14.4 %
Neutro Abs: 2376 cells/uL (ref 1500–7800)
Neutrophils Relative %: 44 %
Platelets: 302 10*3/uL (ref 140–400)
RBC: 4.17 10*6/uL (ref 3.80–5.10)
RDW: 12.3 % (ref 11.0–15.0)
Total Lymphocyte: 39.1 %
WBC: 5.4 10*3/uL (ref 3.8–10.8)

## 2020-10-12 LAB — HEMOGLOBIN A1C
Hgb A1c MFr Bld: 5.7 % of total Hgb — ABNORMAL HIGH (ref <5.7)
Mean Plasma Glucose: 117 mg/dL
eAG (mmol/L): 6.5 mmol/L

## 2020-10-12 LAB — COMPLETE METABOLIC PANEL WITH GFR
AG Ratio: 1.4 (calc) (ref 1.0–2.5)
ALT: 14 U/L (ref 6–29)
AST: 14 U/L (ref 10–35)
Albumin: 4.1 g/dL (ref 3.6–5.1)
Alkaline phosphatase (APISO): 51 U/L (ref 31–125)
BUN: 9 mg/dL (ref 7–25)
CO2: 23 mmol/L (ref 20–32)
Calcium: 9.1 mg/dL (ref 8.6–10.2)
Chloride: 105 mmol/L (ref 98–110)
Creat: 0.8 mg/dL (ref 0.50–1.10)
GFR, Est African American: 103 mL/min/{1.73_m2} (ref >=60)
GFR, Est Non African American: 89 mL/min/{1.73_m2} (ref >=60)
Globulin: 2.9 g/dL (calc) (ref 1.9–3.7)
Glucose, Bld: 92 mg/dL (ref 65–99)
Potassium: 4.4 mmol/L (ref 3.5–5.3)
Sodium: 139 mmol/L (ref 135–146)
Total Bilirubin: 0.2 mg/dL (ref 0.2–1.2)
Total Protein: 7 g/dL (ref 6.1–8.1)

## 2020-10-12 LAB — LIPID PANEL
Cholesterol: 187 mg/dL (ref <200)
HDL: 66 mg/dL (ref >=50)
LDL Cholesterol (Calc): 106 mg/dL (calc) — ABNORMAL HIGH
Non-HDL Cholesterol (Calc): 121 mg/dL (calc) (ref <130)
Total CHOL/HDL Ratio: 2.8 (calc) (ref <5.0)
Triglycerides: 63 mg/dL (ref <150)

## 2020-10-12 LAB — B12 AND FOLATE PANEL
Folate: 12.5 ng/mL
Vitamin B-12: 433 pg/mL (ref 200–1100)

## 2020-10-12 LAB — HEPATITIS C ANTIBODY
Hepatitis C Ab: NONREACTIVE
SIGNAL TO CUT-OFF: 0.01 (ref <1.00)

## 2020-10-12 LAB — TSH: TSH: 1.12 mIU/L

## 2020-10-12 LAB — VITAMIN D 25 HYDROXY (VIT D DEFICIENCY, FRACTURES): Vit D, 25-Hydroxy: 31 ng/mL (ref 30–100)

## 2020-10-13 ENCOUNTER — Encounter: Payer: Self-pay | Admitting: Internal Medicine

## 2020-10-14 LAB — PAP, TP IMAGING W/ HPV RNA, RFLX HPV TYPE 16,18/45: HPV DNA High Risk: NOT DETECTED

## 2020-11-16 ENCOUNTER — Ambulatory Visit (INDEPENDENT_AMBULATORY_CARE_PROVIDER_SITE_OTHER): Payer: 59 | Admitting: *Deleted

## 2020-11-16 ENCOUNTER — Other Ambulatory Visit: Payer: Self-pay

## 2020-11-16 ENCOUNTER — Other Ambulatory Visit (HOSPITAL_COMMUNITY): Payer: Self-pay

## 2020-11-16 VITALS — Ht 64.0 in | Wt 161.4 lb

## 2020-11-16 DIAGNOSIS — Z1211 Encounter for screening for malignant neoplasm of colon: Secondary | ICD-10-CM

## 2020-11-16 MED ORDER — CLENPIQ 10-3.5-12 MG-GM -GM/160ML PO SOLN
1.0000 | Freq: Once | ORAL | 0 refills | Status: AC
  Filled 2020-11-16: qty 320, 1d supply, fill #0
  Filled 2020-11-25: qty 320, 2d supply, fill #0

## 2020-11-16 NOTE — Patient Instructions (Signed)
[place]   Patient Name:  Bing Ree Kinnett Date of procedure: 12/07/2020 Time to register at [place] Stay: 1:00 PM Provider:  Dr. Gala Romney  Please notify us immediately if you are diabetic, take iron supplements, or if you are on coumadin or any blood thinners.  Please hold the following medications: n/a  Note: Do NOT refrigerate or freeze CLENPIQ. CLENPIQ is ready to drink. There is no need to add any other liquid or mix the medicine in the bottle before you start dosing.   12/06/2020- 1 Day prior to procedure:     CLEAR LIQUIDS ALL DAY--NO SOLID FOODS OR DAIRY PRODUCTS! See list of liquids that are allowed and items that are NOT allowed below.   Diabetic Medication Instructions:  n/a   You must drink plenty of CLEAR LIQUIDS starting before your bowel prep. It is important to stay adequately hydrated before, during, and after your bowel prep for the prep to work effectively!   At 4:00 PM Begin the prep as follows:    1. Drink one bottle of pre-mixed CLENPIQ right from the bottle.  2. Drink at least five (5) 8-ounce drinks of clear liquids of your choice within the next 5 hours   At 10:00 PM: 1. Drink the second bottle of pre-mixed CLENPIQ right from the bottle.   2. Drink at least three (3) 8-ounce drinks of clear liquids of your choice within the next 3 hours before going to bed.   Continue clear liquids.    12/07/2020-  Day of Procedure:   Diabetic medications adjustments: n/a   You may take TYLENOL products.  Please continue your regular medications unless we have instructed you otherwise.     At 3 hours before procedure @ 11:00 am: Stop drinking all liquids, nothing by mouth at this point.   Please note, on the day of your procedure you MUST be accompanied by an adult who is willing to assume responsibility for you at time of discharge. If you do not have such person with you, your procedure will have to be rescheduled.                                                                                                                      Please leave ALL jewelry at home prior to coming to the hospital for your procedure.   *It is your responsibility to check with your insurance company for the benefits of coverage you have for this procedure. Unfortunately, not all insurance companies have benefits to cover all or part of these types of procedures. It is your responsibility to check your benefits, however we will be glad to assist you with any codes your insurance company may need.   Please note that most insurance companies will not cover a screening colonoscopy for people under the age of 21  For example, with some insurance companies you may have benefits for a screening colonoscopy, but if polyps are found the diagnosis will change and then you  may have a deductible that will need to be met. Please make sure you check your benefits for screening colonoscopy as well as a diagnostic colonoscopy.    CLEAR LIQUIDS: (NO RED or PURPLE) Water  Jello   Apple Juice  White Grape Juice   Kool-Aid Soft drinks  Banana popsicles Sports Drink  Black coffee (No cream or milk) Tea (No cream or milk)  Broth (fat free beef/chicken/vegetable)  Clear liquids allow you to see your fingers on the other side of the glass.  Be sure they are NOT RED or PURPLE in color, cloudy, but CLEAR.  Do Not Eat: Dairy products of any kind Cranberry juice Tomato or V8 Juice  Orange Juice   Grapefruit Juice Red Grape Juice Alcohol   Non-dairy creamer Solid foods like cereal, oatmeal, yogurt, fruits, vegetables, creamed soups, eggs, bread, etc   HELPFUL HINTS TO MAKE DRINKING EASIER: -Trying drinking through a straw. -If you become nauseated, try consuming smaller amounts or stretch out the time between glasses.  Stop for 30 minutes & slowly start back drinking.  Call our office with any questions or concerns at [phone].  Thank You,  Christ Kick, [place]

## 2020-11-16 NOTE — Progress Notes (Addendum)
Gastroenterology Pre-Procedure Review  Request Date: 11/16/2020 Requesting Physician: Noemi Chapel, NP @ [place], no previous TCS, family hx of colon cancer (grandmother)  PATIENT REVIEW QUESTIONS: The patient responded to the following health history questions as indicated:    1. Diabetes Melitis: no 2. Joint replacements in the past 12 months: no 3. Major health problems in the past 3 months: no 4. Has an artificial valve or MVP: no 5. Has a defibrillator: no 6. Has been advised in past to take antibiotics in advance of a procedure like teeth cleaning: no 7. Family history of colon cancer: yes, grandmother: age 85  8. Alcohol Use: no 9. Illicit drug Use: no 10. History of sleep apnea: no  11. History of coronary artery or other vascular stents placed within the last 12 months: no 12. History of any prior anesthesia complications: no 13. Body mass index is 27.7 kg/m.    MEDICATIONS & ALLERGIES:    Patient reports the following regarding taking any blood thinners:   Plavix? no Aspirin? no Coumadin? no Brilinta? no Xarelto? no Eliquis? no Pradaxa? no Savaysa? no Effient? no  Patient confirms/reports the following medications:  Current Outpatient Medications  Medication Sig Dispense Refill  . ascorbic acid (VITAMIN C) 500 MG tablet Take 500 mg by mouth daily.    . betamethasone dipropionate 0.05 % cream APPLY TO AFFECTED AREA UP TO TWICE DAILY AS NEEDED. (NOT ON FACE, GROIN, AND UNDERARMS). 45 g 3  . Biotin 5 MG CAPS Take by mouth daily.    . Cholecalciferol (VITAMIN D) 125 MCG (5000 UT) CAPS Take by mouth daily.    . Fluocinolone Acetonide Body (DERMA-SMOOTHE/FS BODY) 0.01 % OIL Apply to affected ares on scalp once a day as needed 120 mL 2  . ketoconazole (NIZORAL) 2 % shampoo Apply 1 application topically 2 (two) times a week. 120 mL 1  . Multiple Vitamin (MULTIVITAMIN WITH MINERALS) TABS tablet Take 1 tablet by mouth daily.    . Sod Picosulfate-Mag  Ox-Cit Acd (CLENPIQ) 10-3.5-12 MG-GM -GM/160ML SOLN Take 1 kit by mouth once for 1 dose. 320 mL 0  . triamcinolone cream (KENALOG) 0.1 % Apply 1 application topically 2 (two) times daily. (Patient taking differently: Apply 1 application topically as needed.) 30 g 1  . Zinc 30 MG CAPS Take by mouth daily.     No current facility-administered medications for this visit.    Patient confirms/reports the following allergies:  No Known Allergies  No orders of the defined types were placed in this encounter.   AUTHORIZATION INFORMATION Primary Insurance: [employer] [place],  [place] #:[id number] ,  Group #: [id number] Pre-Cert / Josem Kaufmann required: No, not required per Samara Snide / Auth #: [id number]  SCHEDULE INFORMATION: Procedure has been scheduled as follows:  Date: 12/07/2020, Time: 2:00 Location: [facility] with Dr. Gala Romney  This Gastroenterology Pre-Precedure Review Form is being routed to the following provider(s): Neil Crouch, PA

## 2020-11-23 NOTE — Progress Notes (Signed)
Ok to schedule. ASA I.

## 2020-11-24 ENCOUNTER — Other Ambulatory Visit (HOSPITAL_COMMUNITY): Payer: Self-pay

## 2020-11-25 ENCOUNTER — Other Ambulatory Visit (HOSPITAL_COMMUNITY): Payer: Self-pay

## 2020-11-25 ENCOUNTER — Other Ambulatory Visit: Payer: Self-pay | Admitting: *Deleted

## 2020-11-25 NOTE — Progress Notes (Signed)
Called [facility] and spoke to [place].  There is no age limit for colonoscopies and no pre-cert required.  Ref#: [id number]

## 2020-12-05 ENCOUNTER — Other Ambulatory Visit (HOSPITAL_COMMUNITY): Admission: RE | Admit: 2020-12-05 | Payer: 59 | Source: Ambulatory Visit

## 2020-12-06 DIAGNOSIS — Z1211 Encounter for screening for malignant neoplasm of colon: Secondary | ICD-10-CM | POA: Diagnosis not present

## 2020-12-06 LAB — PREGNANCY, URINE: Preg Test, Ur: NEGATIVE

## 2020-12-07 ENCOUNTER — Encounter (HOSPITAL_COMMUNITY): Payer: Self-pay | Admitting: Internal Medicine

## 2020-12-07 ENCOUNTER — Ambulatory Visit (HOSPITAL_COMMUNITY)
Admission: RE | Admit: 2020-12-07 | Discharge: 2020-12-07 | Disposition: A | Discharge status: Home / Self Care | Payer: 59 | Attending: Internal Medicine | Admitting: Internal Medicine

## 2020-12-07 ENCOUNTER — Encounter (HOSPITAL_COMMUNITY)
Admission: RE | Disposition: A | Discharge status: Home / Self Care | Payer: Self-pay | Source: Home / Self Care | Attending: Internal Medicine

## 2020-12-07 ENCOUNTER — Other Ambulatory Visit: Payer: Self-pay

## 2020-12-07 DIAGNOSIS — Z1211 Encounter for screening for malignant neoplasm of colon: Secondary | ICD-10-CM | POA: Diagnosis not present

## 2020-12-07 HISTORY — PX: COLONOSCOPY: SHX5424

## 2020-12-07 SURGERY — COLONOSCOPY
Anesthesia: Moderate Sedation

## 2020-12-07 MED ORDER — ONDANSETRON HCL 4 MG/2ML IJ SOLN
INTRAMUSCULAR | Status: DC | PRN
  Administered 2020-12-07: 4 mg via INTRAVENOUS

## 2020-12-07 MED ORDER — STERILE WATER FOR IRRIGATION IR SOLN
Status: DC | PRN
  Administered 2020-12-07: 100 mL

## 2020-12-07 MED ORDER — MEPERIDINE HCL 50 MG/ML IJ SOLN
INTRAMUSCULAR | Status: AC
  Filled 2020-12-07: qty 1

## 2020-12-07 MED ORDER — SODIUM CHLORIDE 0.9 % IV SOLN: INTRAVENOUS | Status: DC

## 2020-12-07 MED ORDER — MEPERIDINE HCL 100 MG/ML IJ SOLN
INTRAMUSCULAR | Status: DC | PRN
  Administered 2020-12-07: 40 mg via INTRAVENOUS
  Administered 2020-12-07: 10 mg via INTRAVENOUS

## 2020-12-07 MED ORDER — ONDANSETRON HCL 4 MG/2ML IJ SOLN
INTRAMUSCULAR | Status: AC
  Filled 2020-12-07: qty 2

## 2020-12-07 MED ORDER — MIDAZOLAM HCL 5 MG/5ML IJ SOLN
INTRAMUSCULAR | Status: DC | PRN
  Administered 2020-12-07 (×2): 2 mg via INTRAVENOUS

## 2020-12-07 MED ORDER — MIDAZOLAM HCL 5 MG/5ML IJ SOLN
INTRAMUSCULAR | Status: AC
  Filled 2020-12-07: qty 10

## 2020-12-07 NOTE — H&P (Signed)
@LOGO @   Primary Care Physician:  Eulogio Bear, NP Primary Gastroenterologist:  Dr. Gala Romney  Pre-Procedure History & Physical: HPI:  Jennifer Hansen is a 45 y.o. female is here for a screening colonoscopy.  No bowel symptoms.  No first-degree relatives with colon cancer.  Grandmother with colon cancer at a young age.  No prior colonoscopy. Past Medical History:  Diagnosis Date  . Acne     Past Surgical History:  Procedure Laterality Date  . BUNIONECTOMY    . TUBAL LIGATION  2004    Prior to Admission medications   Medication Sig Start Date End Date Taking? Authorizing Provider  Ascorbic Acid (VITAMIN C) 1000 MG tablet Take 1,000 mg by mouth daily.   Yes [provider]  betamethasone dipropionate 0.05 % cream APPLY TO AFFECTED AREA UP TO TWICE DAILY AS NEEDED. (NOT ON FACE, GROIN, AND UNDERARMS). Patient taking differently: Apply 1 application topically 2 (two) times daily as needed (cerebric dermatitis). 02/17/20 02/16/21 Yes Allyn Kenner, MD  Biotin 5 MG CAPS Take 5 mg by mouth daily.   Yes [provider]  Cholecalciferol (VITAMIN D) 125 MCG (5000 UT) CAPS Take 5,000 Units by mouth daily.   Yes [provider]  Multiple Vitamin (MULTIVITAMIN WITH MINERALS) TABS tablet Take 1 tablet by mouth daily.   Yes [provider]  Fluocinolone Acetonide Body (DERMA-SMOOTHE/FS BODY) 0.01 % OIL Apply to affected ares on scalp once a day as needed Patient taking differently: Apply 1 application topically daily as needed (cerebric dermatitis). 10/09/19   Easton, Modena Nunnery, MD  ketoconazole (NIZORAL) 2 % shampoo Apply 1 application topically 2 (two) times a week. Patient taking differently: Apply 1 application topically as needed (cerebric dermatitis). 10/12/19   Alycia Rossetti, MD  triamcinolone cream (KENALOG) 0.1 % Apply 1 application topically 2 (two) times daily. Patient taking differently: Apply 1 application topically as needed (cerebric dermatitis).  06/23/18   Alycia Rossetti, MD    Allergies as of 11/24/2020  . (No Known Allergies)    Family History  Problem Relation Age of Onset  . Cancer Maternal Grandmother        colon cancer  . Diabetes Maternal Grandmother   . Heart disease Maternal Grandmother   . Diabetes Father   . Arthritis Father   . Aneurysm Paternal Grandfather   . Cancer Maternal Grandfather   . Arthritis Mother     Social History   Socioeconomic History  . Marital status: Widowed    Spouse name: Not on file  . Number of children: Not on file  . Years of education: Not on file  . Highest education level: Not on file  Occupational History  . Not on file  Tobacco Use  . Smoking status: Never Smoker  . Smokeless tobacco: Never Used  Vaping Use  . Vaping Use: Never used  Substance and Sexual Activity  . Alcohol use: No  . Drug use: No  . Sexual activity: Not Currently    Birth control/protection: Surgical    Comment: tubal  Other Topics Concern  . Not on file  Social History Narrative  . Not on file   Social Determinants of Health   Financial Resource Strain: Not on file  Food Insecurity: Not on file  Transportation Needs: Not on file  Physical Activity: Not on file  Stress: Not on file  Social Connections: Not on file  Intimate Partner Violence: Not on file    Review of Systems: See HPI, otherwise  negative ROS  Physical Exam: BP 115/82   Pulse 68   Temp 98.8 F (37.1 C) (Oral)   Resp 17   Ht 5\' 4"  (1.626 m)   Wt 73.5 kg   LMP 12/02/2020 (Exact Date)   SpO2 100%   BMI 27.81 kg/m  General:   Alert,  Well-developed, well-nourished, pleasant and cooperative in NAD Lungs:  Clear throughout to auscultation.   No wheezes, crackles, or rhonchi. No acute distress. Heart:  Regular rate and rhythm; no murmurs, clicks, rubs,  or gallops. Abdomen:  Soft, nontender and nondistended. No masses, hepatosplenomegaly or hernias noted. Normal bowel sounds, without guarding, and without  rebound.    Impression/Plan: Jennifer Hansen is now here to undergo a screening colonoscopy.  First ever average rescreening examination.  Risks, benefits, limitations, imponderables and alternatives regarding colonoscopy have been reviewed with the patient. Questions have been answered. All parties agreeable.     Notice:  This dictation was prepared with Dragon dictation along with smaller phrase technology. Any transcriptional errors that result from this process are unintentional and may not be corrected upon review.

## 2020-12-07 NOTE — Discharge Instructions (Signed)
  Colonoscopy Discharge Instructions  Read the instructions outlined below and refer to this sheet in the next few weeks. These discharge instructions provide you with general information on caring for yourself after you leave the hospital. Your doctor may also give you specific instructions. While your treatment has been planned according to the most current medical practices available, unavoidable complications occasionally occur. If you have any problems or questions after discharge, call Dr. Gala Romney at [phone]. ACTIVITY  You may resume your regular activity, but move at a slower pace for the next 24 hours.   Take frequent rest periods for the next 24 hours.   Walking will help get rid of the air and reduce the bloated feeling in your belly (abdomen).   No driving for 24 hours (because of the medicine (anesthesia) used during the test).    Do not sign any important legal documents or operate any machinery for 24 hours (because of the anesthesia used during the test).  NUTRITION  Drink plenty of fluids.   You may resume your normal diet as instructed by your doctor.   Begin with a light meal and progress to your normal diet. Heavy or fried foods are harder to digest and may make you feel sick to your stomach (nauseated).   Avoid alcoholic beverages for 24 hours or as instructed.  MEDICATIONS  You may resume your normal medications unless your doctor tells you otherwise.  WHAT YOU CAN EXPECT TODAY  Some feelings of bloating in the abdomen.   Passage of more gas than usual.   Spotting of blood in your stool or on the toilet paper.  IF YOU HAD POLYPS REMOVED DURING THE COLONOSCOPY:  No aspirin products for 7 days or as instructed.   No alcohol for 7 days or as instructed.   Eat a soft diet for the next 24 hours.  FINDING OUT THE RESULTS OF YOUR TEST Not all test results are available during your visit. If your test results are not back during the visit, make an appointment  with your caregiver to find out the results. Do not assume everything is normal if you have not heard from your caregiver or the medical facility. It is important for you to follow up on all of your test results.  SEEK IMMEDIATE MEDICAL ATTENTION IF:  You have more than a spotting of blood in your stool.   Your belly is swollen (abdominal distention).   You are nauseated or vomiting.   You have a temperature over 101.   You have abdominal pain or discomfort that is severe or gets worse throughout the day.   Your colonoscopy was normal today.  I recommend you return in 10 years for another examination  If you experience any interim bowel symptoms/problems, please let me know  At patient request, I called Norlene Duel at [phone] reviewed findings and recommendations

## 2020-12-07 NOTE — Op Note (Signed)
[facility] Patient Name: Jennifer Hansen Procedure Date: 12/07/2020 7:21 AM MRN: [id number] Date of Birth: 01-Jan-1976 Attending MD: Norvel Richards , MD CSN: [id number] Age: 45 Admit Type: Outpatient Procedure:                Colonoscopy Indications:              Screening for colorectal malignant neoplasm Providers:                Norvel Richards, MD, Rosina Lowenstein, RN, Raphael Gibney, Technician Referring MD:              Medicines:                Midazolam 4 mg IV, Meperidine 50 mg IV Complications:            No immediate complications. Estimated Blood Loss:     Estimated blood loss: none. Procedure:                Pre-Anesthesia Assessment:                           - Prior to the procedure, a History and Physical                            was performed, and patient medications and                            allergies were reviewed. The patient's tolerance of                            previous anesthesia was also reviewed. The risks                            and benefits of the procedure and the sedation                            options and risks were discussed with the patient.                            All questions were answered, and informed consent                            was obtained. Prior Anticoagulants: The patient has                            taken no previous anticoagulant or antiplatelet                            agents. ASA Grade Assessment: II - A patient with                            mild systemic disease. After reviewing the risks  and benefits, the patient was deemed in                            satisfactory condition to undergo the procedure.                           After obtaining informed consent, the colonoscope                            was passed under direct vision. Throughout the                            procedure, the patient's blood pressure, pulse, and                             oxygen saturations were monitored continuously. The                            CF-[id number] ([id number]) scope was introduced through                            the anus and advanced to the the cecum, identified                            by appendiceal orifice and ileocecal valve. The                            colonoscopy was performed without difficulty. The                            patient tolerated the procedure well. The quality                            of the bowel preparation was adequate. Scope In: 7:44:35 AM Scope Out: 7:57:34 AM Scope Withdrawal Time: 0 hours 7 minutes 55 seconds  Total Procedure Duration: 0 hours 12 minutes 59 seconds  Findings:      The perianal and digital rectal examinations were normal.      The colon (entire examined portion) appeared normal.      The retroflexed view of the distal rectum and anal verge was normal and       showed no anal or rectal abnormalities. Impression:               - The entire examined colon is normal.                           - The distal rectum and anal verge are normal on                            retroflexion view.                           - No specimens collected. Moderate Sedation:      Moderate (conscious) sedation was administered by the endoscopy nurse  and supervised by the endoscopist. The following parameters were       monitored: oxygen saturation, heart rate, blood pressure, respiratory       rate, EKG, adequacy of pulmonary ventilation, and response to care.       Total physician intraservice time was 18 minutes. Recommendation:           - Patient has a contact number available for                            emergencies. The signs and symptoms of potential                            delayed complications were discussed with the                            patient. Return to normal activities tomorrow.                            Written discharge instructions were provided to the                             patient.                           - Advance diet as tolerated.                           - Repeat colonoscopy in 10 years for screening                            purposes.                           - Return to GI office (date not yet determined). Procedure Code(s):        --- Professional ---                           [phone], Colonoscopy, flexible; diagnostic, including                            collection of specimen(s) by brushing or washing,                            when performed (separate procedure)                           G0500, Moderate sedation services provided by the                            same physician or other qualified health care                            professional performing a gastrointestinal                            endoscopic service that sedation supports,  requiring the presence of an independent trained                            observer to assist in the monitoring of the                            patient's level of consciousness and physiological                            status; initial 15 minutes of intra-service time;                            patient age 41 years or older (additional time may                            be reported with [phone], as appropriate) Diagnosis Code(s):        --- Professional ---                           Z12.11, Encounter for screening for malignant                            neoplasm of colon CPT copyright 2019 [employer]. All rights reserved. The codes documented in this report are preliminary and upon coder review may  be revised to meet current compliance requirements. Cristopher Estimable. Haiven Nardone, MD Norvel Richards, MD 12/07/2020 8:06:04 AM This report has been signed electronically. Number of Addenda: 0

## 2020-12-15 ENCOUNTER — Encounter (HOSPITAL_COMMUNITY): Payer: Self-pay | Admitting: Internal Medicine

## 2020-12-21 ENCOUNTER — Other Ambulatory Visit (HOSPITAL_COMMUNITY): Payer: Self-pay

## 2020-12-21 ENCOUNTER — Telehealth: Payer: 59 | Admitting: Physician Assistant

## 2020-12-21 DIAGNOSIS — B373 Candidiasis of vulva and vagina: Secondary | ICD-10-CM | POA: Diagnosis not present

## 2020-12-21 DIAGNOSIS — B3731 Acute candidiasis of vulva and vagina: Secondary | ICD-10-CM

## 2020-12-21 MED ORDER — FLUCONAZOLE 150 MG PO TABS
150.0000 mg | ORAL_TABLET | Freq: Once | ORAL | 0 refills | Status: AC
  Filled 2020-12-21: qty 1, 1d supply, fill #0

## 2020-12-21 NOTE — Progress Notes (Signed)
I have spent 5 minutes in review of e-visit questionnaire, review and updating patient chart, medical decision making and response to patient.   Laveah Gloster Cody Hamdi Vari, PA-C    

## 2020-12-21 NOTE — Progress Notes (Signed)

## 2021-02-05 ENCOUNTER — Telehealth: Payer: 59 | Admitting: Physician Assistant

## 2021-02-05 DIAGNOSIS — B379 Candidiasis, unspecified: Secondary | ICD-10-CM | POA: Diagnosis not present

## 2021-02-05 DIAGNOSIS — T3695XA Adverse effect of unspecified systemic antibiotic, initial encounter: Secondary | ICD-10-CM | POA: Diagnosis not present

## 2021-02-05 MED ORDER — FLUCONAZOLE 150 MG PO TABS: 150.0000 mg | ORAL_TABLET | Freq: Once | ORAL | 0 refills | Status: AC

## 2021-02-05 NOTE — Progress Notes (Signed)
I have spent 5 minutes in review of e-visit questionnaire, review and updating patient chart, medical decision making and response to patient.   Calysta Craigo Cody Brentney Goldbach, PA-C    

## 2021-02-05 NOTE — Progress Notes (Signed)

## 2021-02-20 ENCOUNTER — Other Ambulatory Visit (HOSPITAL_COMMUNITY): Payer: Self-pay

## 2021-03-02 ENCOUNTER — Telehealth: Payer: 59 | Admitting: Physician Assistant

## 2021-03-02 DIAGNOSIS — B3731 Acute candidiasis of vulva and vagina: Secondary | ICD-10-CM

## 2021-03-02 DIAGNOSIS — B373 Candidiasis of vulva and vagina: Secondary | ICD-10-CM

## 2021-03-02 MED ORDER — FLUCONAZOLE 150 MG PO TABS: 150.0000 mg | ORAL_TABLET | Freq: Once | ORAL | 0 refills | Status: AC

## 2021-03-02 NOTE — Progress Notes (Signed)

## 2021-03-16 ENCOUNTER — Encounter: Payer: Self-pay | Admitting: Nurse Practitioner

## 2021-03-16 ENCOUNTER — Other Ambulatory Visit: Payer: Self-pay

## 2021-03-16 ENCOUNTER — Ambulatory Visit: Payer: 59 | Admitting: Nurse Practitioner

## 2021-03-16 ENCOUNTER — Other Ambulatory Visit (HOSPITAL_COMMUNITY): Payer: Self-pay

## 2021-03-16 VITALS — BP 122/80 | HR 65 | Temp 98.5°F | Ht 64.0 in | Wt 161.4 lb

## 2021-03-16 DIAGNOSIS — R002 Palpitations: Secondary | ICD-10-CM

## 2021-03-16 DIAGNOSIS — R399 Unspecified symptoms and signs involving the genitourinary system: Secondary | ICD-10-CM

## 2021-03-16 DIAGNOSIS — N898 Other specified noninflammatory disorders of vagina: Secondary | ICD-10-CM

## 2021-03-16 DIAGNOSIS — R61 Generalized hyperhidrosis: Secondary | ICD-10-CM

## 2021-03-16 DIAGNOSIS — L249 Irritant contact dermatitis, unspecified cause: Secondary | ICD-10-CM | POA: Diagnosis not present

## 2021-03-16 LAB — WET PREP FOR TRICH, YEAST, CLUE

## 2021-03-16 MED ORDER — CEPHALEXIN 250 MG PO CAPS
250.0000 mg | ORAL_CAPSULE | Freq: Every day | ORAL | 0 refills | Status: DC | PRN
  Filled 2021-03-16: qty 30, 30d supply, fill #0

## 2021-03-16 MED ORDER — TRIAMCINOLONE ACETONIDE 0.1 % EX CREA
1.0000 "application " | TOPICAL_CREAM | CUTANEOUS | 1 refills | Status: AC | PRN
  Filled 2021-03-16: qty 30, 30d supply, fill #0
  Filled 2022-03-06: qty 30, 30d supply, fill #1

## 2021-03-16 MED ORDER — DRYSOL 20 % EX SOLN
Freq: Every day | CUTANEOUS | 0 refills | Status: DC
  Filled 2021-03-16: qty 35, 30d supply, fill #0

## 2021-03-16 NOTE — Progress Notes (Signed)
Subjective:    Patient ID: Jennifer Hansen, female    DOB: July 20, 1975, 45 y.o.   MRN: [id number]  HPI: Jennifer Hansen is a 45 y.o. female presenting with multiple concerns.  Chief Complaint  Patient presents with   possible uti     Possible uti / possible yeast infection , did have yeast infection treatment, urination freq,no fever ,no chills, some blood in urine  , rash on knee , and arm ,itching off and on    LMP 03/09/21  URINARY SYMPTOMS Thinks she has had multiple UTI in the past few months.  She has done e-visits.  Last urine sample in February did not show positive culture.  She was advised to start cephalexin after sexual intercourse for prophylactic treatment.  She has been doing this "most of the time." Dysuria: no Urinary frequency: yes Urgency: no Small volume voids: ranges Symptom severity: mild Urinary incontinence: no Foul odor: no Hematuria: no Abdominal pain: no Back pain: no Suprapubic pain/pressure: no Flank pain: no Fever:  no Nausea: no Vomiting: no Relief with cranberry juice: no Relief with pyridium: no Status: better Previous urinary tract infection: no Recurrent urinary tract infection: yes Sexual activity: currently sexually active with 1 partner, tries to urinate every time after intercourse History of sexually transmitted disease: no Vaginal discharge: no Treatments attempted: increasing fluids   VAGINAL DISCOMFORT Used to use [employer] feminine wash, recently switched to [facility] for sensitive skin.   Pruritus: no Dysuria: no Malodorous: no Urinary frequency: no Fevers: no Abdominal pain: no  Sexual activity: currently sexually actie with 1 female partner History of sexually transmitted diseases: no Recent antibiotic use: yes Context: stable  Treatments attempted: multiple e-visits, 11/2020 - yeast vaginitis; fluconazole 150 mg 01/2021 - antibiotic induced yeast vaginitis; fluconazole 150 mg 03/2021 - yeast vaginitis; fluconazole  150 mg once then repeat in 48-72 hours if no better  PALPITATIONS Was at church having a work shop, felt heart fluttering.  She had just eaten.  This has happened a few times in the past few weeks.  Apple watch does not notify her of increased heart rate. Duration: days Symptom description: heart racing Duration of episode: second Frequency: no history of same Activity when event occurred: no Related to exertion: no Dyspnea: no Chest pain: no Syncope: no Anxiety/stress: no Nausea/vomiting: no Diaphoresis: no Coronary artery disease: no Congestive heart failure: no Arrhythmia:no Thyroid disease: no Caffeine intake: no Status:  resolved Water intake: drinks plenty Treatments attempted:nothing   RASH Duration:  days  Location: leg , arm   Itching: yes Burning: no Redness: no Oozing: no Scaling: no Blisters: no Painful: no Fevers: no Change in detergents/soaps/personal care products: no Recent illness: no Recent travel:no History of same: yes Context: stable Alleviating factors: steroid cream Treatments attempted: nothing Shortness of breath: no  Throat/tongue swelling: no Myalgias/arthralgias: no  Also reports increased sweating in her groin area, usually an area that does not sweat.  She is wondering what can be done about this.   No Known Allergies  Outpatient Encounter Medications as of 03/16/2021  Medication Sig   aluminum chloride (DRYSOL) 20 % external solution Apply topically at bedtime.   Ascorbic Acid (VITAMIN C) 1000 MG tablet Take 1,000 mg by mouth daily.   Biotin 5 MG CAPS Take 5 mg by mouth daily.   Cholecalciferol (VITAMIN D) 125 MCG (5000 UT) CAPS Take 5,000 Units by mouth daily.   ketoconazole (NIZORAL) 2 % shampoo Apply 1 application topically 2 (  two) times a week. (Patient taking differently: Apply 1 application topically as needed (cerebric dermatitis).)   Multiple Vitamin (MULTIVITAMIN WITH MINERALS) TABS tablet Take 1 tablet by mouth daily.    [DISCONTINUED] triamcinolone cream (KENALOG) 0.1 % Apply 1 application topically 2 (two) times daily. (Patient taking differently: Apply 1 application topically as needed (cerebric dermatitis).)   cephALEXin (KEFLEX) 250 MG capsule Take 1 capsule (250 mg total) by mouth daily as needed (immediatley after intercourse).   Fluocinolone Acetonide Body (DERMA-SMOOTHE/FS BODY) 0.01 % OIL Apply to affected ares on scalp once a day as needed (Patient not taking: Reported on 03/16/2021)   triamcinolone cream (KENALOG) 0.1 % Apply 1 application topically as needed (cerebric dermatitis).   No facility-administered encounter medications on file as of 03/16/2021.    Patient Active Problem List   Diagnosis Date Noted   Prediabetes 10/12/2020   Dermoid cyst of leg, left 10/11/2020   Bunion 11/18/2015    Past Medical History:  Diagnosis Date   Acne     Relevant past medical, surgical, family and social history reviewed and updated as indicated. Interim medical history since our last visit reviewed.  Review of Systems Per HPI unless specifically indicated above     Objective:    BP 122/80 (BP Location: Left Arm, Patient Position: Sitting)   Pulse 65   Temp 98.5 F (36.9 C)   Ht '5\' 4"'$  (1.626 m)   Wt 161 lb 6.4 oz (73.2 kg)   LMP 03/09/2021   SpO2 95%   BMI 27.70 kg/m   Wt Readings from Last 3 Encounters:  03/16/21 161 lb 6.4 oz (73.2 kg)  12/07/20 162 lb (73.5 kg)  11/16/20 161 lb 6.4 oz (73.2 kg)    Physical Exam Vitals and nursing note reviewed. Exam conducted with a chaperone present (TV).  Constitutional:      General: She is not in acute distress.    Appearance: Normal appearance. She is not toxic-appearing.  Cardiovascular:     Heart sounds: Normal heart sounds. No murmur heard. Abdominal:     General: Abdomen is flat. Bowel sounds are normal. There is no distension.     Palpations: Abdomen is soft. There is no mass.     Tenderness: There is no right CVA tenderness or left  CVA tenderness.  Genitourinary:    General: Normal vulva.     Exam position: Lithotomy position.     Pubic Area: No rash or pubic lice.      Labia:        Right: No tenderness or lesion.        Left: No tenderness or lesion.      Vagina: Vaginal discharge present.     Cervix: No cervical motion tenderness.     Uterus: Normal.      Adnexa: Right adnexa normal and left adnexa normal.     Comments: Small amount white, thin, nonodorours discharge Musculoskeletal:     Right lower leg: No edema.     Left lower leg: No edema.  Lymphadenopathy:     Lower Body: No right inguinal adenopathy. No left inguinal adenopathy.  Skin:    General: Skin is warm and dry.     Capillary Refill: Capillary refill takes less than 2 seconds.     Findings: Rash present. Rash is papular and urticarial.     Comments: Papular, flesh colored rash noted to left upper and left lower extremity.  No erythema, patches, oozing.   Neurological:     Mental  Status: She is alert and oriented to person, place, and time.     Motor: No weakness.     Gait: Gait normal.  Psychiatric:        Mood and Affect: Mood normal.        Behavior: Behavior normal.        Thought Content: Thought content normal.        Judgment: Judgment normal.      Assessment & Plan:  1. UTI symptoms Acute.  UA today slightly cloudy with trace blood, likely secondary to menses ending.  Also trace leukocyte esterase, 6-10 WBC, and few bacteria.  Suggestive of acute infection, however no nitrites.  Will send urine off for culture and treat if culture and sensitivity shows acute infection.  In meantime, refill given for cephalexin 250 mg to use for prophylactic treatment after sexual intercourse.  Discussed urinary and vaginal hygiene.    - cephALEXin (KEFLEX) 250 MG capsule; Take 1 capsule (250 mg total) by mouth daily as needed (immediatley after intercourse).  Dispense: 30 capsule; Refill: 0  2. Sweating increase Unclear etiology - this is  bothersome to the patient so will start drysol in areas of concern.  Advised that this will likely burn and should work better the longer it is used.  Follow up if this does not help with symptoms.  - aluminum chloride (DRYSOL) 20 % external solution; Apply topically at bedtime.  Dispense: 35 mL; Refill: 0  3. Irritant contact dermatitis, unspecified trigger Acute.  Will send in steroid cream.  Not to use for more than 2 weeks consecutively, return to clinic in that timeframe with no improvement.  - triamcinolone cream (KENALOG) 0.1 %; Apply 1 application topically as needed (cerebric dermatitis).  Dispense: 30 g; Refill: 1  4. Vaginal discharge Wet prep today negative for trichomonas, yeast, and clue cells.  Discussed vaginal hygiene at length-do not use any soaps, fragrances, lotions, perfumes on or in perineum.  Can try daily probiotic or daily yogurt.  We are checking urine sample for an acute infection.  Also check gonorrhea chlamydia.  - WET PREP FOR TRICH, YEAST, CLUE - Urinalysis, Routine w reflex microscopic - Urine Culture - C. trachomatis/N. gonorrhoeae RNA  5. Palpitations Acute.  Heart sounds are normal today.  No palpitations today.  These appear to be infrequent right now, not triggered by stress.  Also no associated chest pain, shortness of breath, sweating, or dizziness.  We will continue to monitor closely-if these episodes become more frequent, can have her in for an EKG and can consider referral to cardiology for an event monitor.   Follow up plan: Return if symptoms worsen or fail to improve.

## 2021-03-17 LAB — C. TRACHOMATIS/N. GONORRHOEAE RNA
C. trachomatis RNA, TMA: NOT DETECTED
N. gonorrhoeae RNA, TMA: NOT DETECTED

## 2021-03-20 LAB — URINALYSIS, ROUTINE W REFLEX MICROSCOPIC
Bilirubin Urine: NEGATIVE
Casts: NONE SEEN /LPF
Crystals: NONE SEEN /HPF
Glucose, UA: NEGATIVE
Ketones, ur: NEGATIVE
Nitrite: NEGATIVE
Protein, ur: NEGATIVE
Specific Gravity, Urine: 1.02 (ref 1.001–1.035)
Yeast: NONE SEEN /HPF
pH: 7 (ref 5.0–8.0)

## 2021-03-20 LAB — URINE CULTURE

## 2021-03-20 LAB — MICROSCOPIC MESSAGE

## 2021-03-22 ENCOUNTER — Other Ambulatory Visit: Payer: 59

## 2021-03-23 DIAGNOSIS — N898 Other specified noninflammatory disorders of vagina: Secondary | ICD-10-CM | POA: Diagnosis not present

## 2021-03-24 LAB — URINE CULTURE
MICRO NUMBER:: 12409814
SPECIMEN QUALITY:: ADEQUATE

## 2021-06-05 ENCOUNTER — Other Ambulatory Visit: Payer: Self-pay

## 2021-07-05 ENCOUNTER — Other Ambulatory Visit: Payer: Self-pay | Admitting: Nurse Practitioner

## 2021-07-05 DIAGNOSIS — Z1231 Encounter for screening mammogram for malignant neoplasm of breast: Secondary | ICD-10-CM

## 2021-08-03 ENCOUNTER — Other Ambulatory Visit: Payer: Self-pay

## 2021-08-03 ENCOUNTER — Encounter: Payer: Self-pay | Admitting: Nurse Practitioner

## 2021-08-03 ENCOUNTER — Ambulatory Visit: Payer: 59 | Admitting: Nurse Practitioner

## 2021-08-03 VITALS — BP 108/62 | HR 72 | Ht 64.0 in | Wt 163.0 lb

## 2021-08-03 DIAGNOSIS — R7303 Prediabetes: Secondary | ICD-10-CM

## 2021-08-03 DIAGNOSIS — E663 Overweight: Secondary | ICD-10-CM | POA: Diagnosis not present

## 2021-08-03 DIAGNOSIS — Z139 Encounter for screening, unspecified: Secondary | ICD-10-CM

## 2021-08-03 NOTE — Assessment & Plan Note (Addendum)
Wt Readings from Last 3 Encounters:  08/03/21 163 lb (73.9 kg)  03/16/21 161 lb 6.4 oz (73.2 kg)  12/07/20 162 lb (73.5 kg)  increase exercise.

## 2021-08-03 NOTE — Assessment & Plan Note (Addendum)
Lab Results  Component Value Date   HGBA1C 5.7 (H) 10/11/2020  avoid sugar, soda, cake.

## 2021-08-03 NOTE — Progress Notes (Signed)
New Patient Office Visit  Subjective:  Patient ID: Jennifer Hansen, female    DOB: [birth year]  Age: 46 y.o. MRN: [id number]  CC:  Chief Complaint  Patient presents with   New Patient (Initial Visit)    NP    HPI Jennifer Hansen presents to establish care. Previous PCP was at [facility] pract[facility].  Last annual exam April 2022.Marland Kitchen  Patient is up-to-date with her preventative care.   She has been taking keflex as needed for prophylactic treatment for UTI she has not needed med recently.   Seborrheic dermatitis. Takes Fluocinolone acetonide body oil, ketoconazole shampoo.  Cold sore. Takes valtrex , she has not needed meds in months.     Sweating Increase . Drysol as needed.  Pt is due for covid booster.   Past Medical History:  Diagnosis Date   Acne     Past Surgical History:  Procedure Laterality Date   BUNIONECTOMY     COLONOSCOPY N/A 12/07/2020   Procedure: COLONOSCOPY;  Surgeon: Daneil Dolin, MD;  Location: [facility];  Service: Endoscopy;  Laterality: N/A;  ASA I / 2:00   TUBAL LIGATION  2004    Family History  Problem Relation Age of Onset   Cancer Maternal Grandmother        colon cancer   Diabetes Maternal Grandmother    Heart disease Maternal Grandmother    Diabetes Father    Arthritis Father    Aneurysm Paternal Grandfather    Cancer Maternal Grandfather    Arthritis Mother     Social History   Socioeconomic History   Marital status: Widowed    Spouse name: Not on file   Number of children: Not on file   Years of education: Not on file   Highest education level: Not on file  Occupational History   Not on file  Tobacco Use   Smoking status: Never   Smokeless tobacco: Never  Vaping Use   Vaping Use: Never used  Substance and Sexual Activity   Alcohol use: No   Drug use: No   Sexual activity: Not Currently    Birth control/protection: Surgical    Comment: tubal  Other Topics Concern   Not on file  Social History Narrative   Not  on file   Social Determinants of Health   Financial Resource Strain: Not on file  Food Insecurity: Not on file  Transportation Needs: Not on file  Physical Activity: Not on file  Stress: Not on file  Social Connections: Not on file  Intimate Partner Violence: Not on file    ROS Review of Systems  Constitutional: Negative.   Respiratory: Negative.    Cardiovascular: Negative.   Gastrointestinal: Negative.   Psychiatric/Behavioral: Negative.     Objective:   Today's Vitals: BP 108/62 (BP Location: Left Arm, Patient Position: Sitting, Cuff Size: Normal)    Pulse 72    Ht 5\' 4"  (1.626 m)    Wt 163 lb (73.9 kg)    LMP 08/02/2021 (Exact Date)    SpO2 100%    BMI 27.98 kg/m   Physical Exam Vitals and nursing note reviewed.  Constitutional:      General: She is not in acute distress.    Appearance: She is not ill-appearing, toxic-appearing or diaphoretic.  Cardiovascular:     Rate and Rhythm: Normal rate and regular rhythm.     Pulses: Normal pulses.     Heart sounds: Normal heart sounds. No murmur heard.  No friction rub. No gallop.  Pulmonary:     Effort: Pulmonary effort is normal. No respiratory distress.     Breath sounds: Normal breath sounds. No stridor. No wheezing, rhonchi or rales.  Chest:     Chest wall: No tenderness.  Abdominal:     Palpations: Abdomen is soft.     Tenderness: There is no abdominal tenderness.  Neurological:     Mental Status: She is alert and oriented to person, place, and time.  Psychiatric:        Mood and Affect: Mood normal.        Behavior: Behavior normal.        Thought Content: Thought content normal.        Judgment: Judgment normal.    Assessment & Plan:   Problem List Items Addressed This Visit   None   Outpatient Encounter Medications as of 08/03/2021  Medication Sig   aluminum chloride (DRYSOL) 20 % external solution Apply topically at bedtime.   Ascorbic Acid (VITAMIN C) 1000 MG tablet Take 1,000 mg by mouth daily.    Biotin 5 MG CAPS Take 5 mg by mouth daily.   cephALEXin (KEFLEX) 250 MG capsule Take 1 capsule (250 mg total) by mouth daily as needed (immediatley after intercourse).   Cholecalciferol (VITAMIN D) 125 MCG (5000 UT) CAPS Take 5,000 Units by mouth daily.   Fluocinolone Acetonide Body (DERMA-SMOOTHE/FS BODY) 0.01 % OIL Apply to affected ares on scalp once a day as needed   ketoconazole (NIZORAL) 2 % shampoo Apply 1 application topically 2 (two) times a week. (Patient taking differently: Apply 1 application topically as needed (cerebric dermatitis).)   Multiple Vitamin (MULTIVITAMIN WITH MINERALS) TABS tablet Take 1 tablet by mouth daily.   triamcinolone cream (KENALOG) 0.1 % Apply 1 application topically as needed (cerebric dermatitis).   valACYclovir (VALTREX) 1000 MG tablet Take 1,000 mg by mouth daily.   No facility-administered encounter medications on file as of 08/03/2021.    Follow-up: No follow-ups on file.   Renee Rival, FNP

## 2021-08-03 NOTE — Patient Instructions (Signed)
Please get your covid booster at your pharmacy. Please get your labs done 3-5 days before your next appointment   It is important that you exercise regularly at least 30 minutes 5 times a week.  Think about what you will eat, plan ahead. Choose " clean, green, fresh or frozen" over canned, processed or packaged foods which are more sugary, salty and fatty. 70 to 75% of food eaten should be vegetables and fruit. Three meals at set times with snacks allowed between meals, but they must be fruit or vegetables. Aim to eat over a 12 hour period , example 7 am to 7 pm, and STOP after  your last meal of the day. Drink water,generally about 64 ounces per day, no other drink is as healthy. Fruit juice is best enjoyed in a healthy way, by EATING the fruit.  Thanks for choosing [facility], we consider it a privelige to serve you.

## 2021-08-16 ENCOUNTER — Ambulatory Visit
Admission: RE | Admit: 2021-08-16 | Discharge: 2021-08-16 | Disposition: A | Discharge status: Home / Self Care | Payer: 59 | Source: Ambulatory Visit

## 2021-08-16 DIAGNOSIS — Z1231 Encounter for screening mammogram for malignant neoplasm of breast: Secondary | ICD-10-CM | POA: Diagnosis not present

## 2021-08-16 DIAGNOSIS — Z0100 Encounter for examination of eyes and vision without abnormal findings: Secondary | ICD-10-CM | POA: Diagnosis not present

## 2021-08-16 NOTE — Progress Notes (Signed)
Normal mammogram, thanks

## 2021-10-11 DIAGNOSIS — Z0100 Encounter for examination of eyes and vision without abnormal findings: Secondary | ICD-10-CM | POA: Diagnosis not present

## 2021-10-11 DIAGNOSIS — Z139 Encounter for screening, unspecified: Secondary | ICD-10-CM | POA: Diagnosis not present

## 2021-10-12 LAB — CMP14+EGFR
ALT: 15 IU/L (ref 0–32)
AST: 17 IU/L (ref 0–40)
Albumin/Globulin Ratio: 1.5 (ref 1.2–2.2)
Albumin: 4.3 g/dL (ref 3.8–4.8)
Alkaline Phosphatase: 60 IU/L (ref 44–121)
BUN/Creatinine Ratio: 11 (ref 9–23)
BUN: 10 mg/dL (ref 6–24)
Bilirubin Total: <0.2 mg/dL (ref 0.0–1.2)
CO2: 25 mmol/L (ref 20–29)
Calcium: 9.1 mg/dL (ref 8.7–10.2)
Chloride: 103 mmol/L (ref 96–106)
Creatinine, Ser: 0.93 mg/dL (ref 0.57–1.00)
Globulin, Total: 2.9 g/dL (ref 1.5–4.5)
Glucose: 99 mg/dL (ref 70–99)
Potassium: 4.2 mmol/L (ref 3.5–5.2)
Sodium: 137 mmol/L (ref 134–144)
Total Protein: 7.2 g/dL (ref 6.0–8.5)
eGFR: 77 mL/min/{1.73_m2} (ref >59)

## 2021-10-12 LAB — LIPID PANEL
Chol/HDL Ratio: 3.5 ratio (ref 0.0–4.4)
Cholesterol, Total: 193 mg/dL (ref 100–199)
HDL: 55 mg/dL (ref >39)
LDL Chol Calc (NIH): 122 mg/dL — ABNORMAL HIGH (ref 0–99)
Triglycerides: 88 mg/dL (ref 0–149)
VLDL Cholesterol Cal: 16 mg/dL (ref 5–40)

## 2021-10-12 LAB — CBC WITH DIFFERENTIAL/PLATELET
Basophils Absolute: 0.1 10*3/uL (ref 0.0–0.2)
Basos: 1 %
EOS (ABSOLUTE): 0.2 10*3/uL (ref 0.0–0.4)
Eos: 3 %
Hematocrit: 38.6 % (ref 34.0–46.6)
Hemoglobin: 12.8 g/dL (ref 11.1–15.9)
Immature Grans (Abs): 0 10*3/uL (ref 0.0–0.1)
Immature Granulocytes: 0 %
Lymphocytes Absolute: 2.5 10*3/uL (ref 0.7–3.1)
Lymphs: 51 %
MCH: 31 pg (ref 26.6–33.0)
MCHC: 33.2 g/dL (ref 31.5–35.7)
MCV: 94 fL (ref 79–97)
Monocytes Absolute: 0.7 10*3/uL (ref 0.1–0.9)
Monocytes: 13 %
Neutrophils Absolute: 1.6 10*3/uL (ref 1.4–7.0)
Neutrophils: 32 %
Platelets: 284 10*3/uL (ref 150–450)
RBC: 4.13 x10E6/uL (ref 3.77–5.28)
RDW: 12.7 % (ref 11.7–15.4)
WBC: 5.1 10*3/uL (ref 3.4–10.8)

## 2021-10-12 LAB — VITAMIN D 25 HYDROXY (VIT D DEFICIENCY, FRACTURES): Vit D, 25-Hydroxy: 33.2 ng/mL (ref 30.0–100.0)

## 2021-10-12 LAB — HEMOGLOBIN A1C
Est. average glucose Bld gHb Est-mCnc: 120 mg/dL
Hgb A1c MFr Bld: 5.8 % — ABNORMAL HIGH (ref 4.8–5.6)

## 2021-10-12 LAB — TSH: TSH: 1.32 u[IU]/mL (ref 0.450–4.500)

## 2021-10-16 ENCOUNTER — Encounter: Payer: 59 | Admitting: Nurse Practitioner

## 2021-10-18 ENCOUNTER — Ambulatory Visit (INDEPENDENT_AMBULATORY_CARE_PROVIDER_SITE_OTHER): Payer: 59 | Admitting: Nurse Practitioner

## 2021-10-18 ENCOUNTER — Encounter: Payer: Self-pay | Admitting: Nurse Practitioner

## 2021-10-18 ENCOUNTER — Other Ambulatory Visit (HOSPITAL_COMMUNITY): Payer: Self-pay

## 2021-10-18 VITALS — BP 132/86 | HR 63 | Ht 64.0 in | Wt 160.0 lb

## 2021-10-18 DIAGNOSIS — Z0000 Encounter for general adult medical examination without abnormal findings: Secondary | ICD-10-CM

## 2021-10-18 DIAGNOSIS — Z0001 Encounter for general adult medical examination with abnormal findings: Secondary | ICD-10-CM | POA: Insufficient documentation

## 2021-10-18 DIAGNOSIS — E663 Overweight: Secondary | ICD-10-CM | POA: Diagnosis not present

## 2021-10-18 DIAGNOSIS — R7303 Prediabetes: Secondary | ICD-10-CM

## 2021-10-18 DIAGNOSIS — R61 Generalized hyperhidrosis: Secondary | ICD-10-CM

## 2021-10-18 DIAGNOSIS — D229 Melanocytic nevi, unspecified: Secondary | ICD-10-CM | POA: Diagnosis not present

## 2021-10-18 MED ORDER — DRYSOL 20 % EX SOLN
1.0000 "application " | Freq: Every day | CUTANEOUS | 0 refills | Status: DC
  Filled 2021-10-18: qty 35, 30d supply, fill #0

## 2021-10-18 NOTE — Assessment & Plan Note (Signed)
Wt Readings from Last 3 Encounters:  ?10/18/21 160 lb (72.6 kg)  ?08/03/21 163 lb (73.9 kg)  ?03/16/21 161 lb 6.4 oz (73.2 kg)  ?has lost 3lb since last visit , does not exercise regularly.  ?Need to increase intake of whole foods consisting of vegetables, protein, less carbohydrate, regularly 64 ounces of water daily, engage in regular moderate exercises at least 150 minutes weekly discussed with patient.  Verbalized understanding ?

## 2021-10-18 NOTE — Assessment & Plan Note (Signed)
Appears normal  ?Denies changes in the mole ?Pt will Monitor for changes and report  ?

## 2021-10-18 NOTE — Patient Instructions (Signed)

## 2021-10-18 NOTE — Assessment & Plan Note (Signed)
Lab Results  ?Component Value Date  ? HGBA1C 5.8 (H) 10/11/2021  ? ?Avoid sugar sweets, juice , engage in daily excercises ?

## 2021-10-18 NOTE — Assessment & Plan Note (Signed)
Annual exam as documented.  ?Counseling done include healthy lifestyle involving committing to 150 minutes of exercise per week, heart healthy diet, and attaining healthy weight. The importance of adequate sleep also discussed.  ?Regular use of seat belt and home safety were also discussed . ?Changes in health habits are decided on by patient with goals and time frames set for achieving them. ?Immunization and cancer screening  needs are specifically addressed at this visit.   ?

## 2021-10-18 NOTE — Progress Notes (Signed)
? ?Complete physical exam ? ?Patient: Jennifer Hansen   DOB: 1975-12-18   46 y.o. Female  MRN: [id number] ? ?Subjective:  ?  ?Chief Complaint  ?Patient presents with  ? Annual Exam  ?  cpe  ? ? ?Jennifer Hansen is a 46 y.o. female who presents today for a complete physical exam. She reports consuming a low fat low sodium diet. Does not exercise regularly She generally feels well. She reports sleeping well. She does not have additional problems to discuss today.  ? ?Last eye exam was 2 months ago, wears prescription glasses.  ?Last dental exam was last month, goes every 6 months.  ?Lat normal mammogram was two months ago.  ?Last normal PAP was in 2022 ?Most recent fall risk assessment: ? ?  10/18/2021  ?  8:29 AM  ?Fall Risk   ?Falls in the past year? 0  ?Number falls in past yr: 0  ?Injury with Fall? 0  ?Risk for fall due to : No Fall Risks  ?Follow up Falls evaluation completed  ? ?  ?Most recent depression screenings: ? ?  10/18/2021  ?  8:29 AM 08/03/2021  ?  9:14 AM  ?PHQ 2/9 Scores  ?PHQ - 2 Score 0 0  ? ? ?Vision:Within last year ? ? ? ?Patient Care Team: ?Renee Rival, FNP as PCP - General (Nurse Practitioner) ?Rourk, Cristopher Estimable, MD as Consulting Physician (Gastroenterology)  ? ?Outpatient Medications Prior to Visit  ?Medication Sig  ? Ascorbic Acid (VITAMIN C) 1000 MG tablet Take 1,000 mg by mouth daily.  ? Biotin 5 MG CAPS Take 5 mg by mouth daily.  ? Cholecalciferol (VITAMIN D) 125 MCG (5000 UT) CAPS Take 5,000 Units by mouth daily.  ? Multiple Vitamin (MULTIVITAMIN WITH MINERALS) TABS tablet Take 1 tablet by mouth daily.  ? triamcinolone cream (KENALOG) 0.1 % Apply 1 application topically as needed (cerebric dermatitis).  ? [DISCONTINUED] aluminum chloride (DRYSOL) 20 % external solution Apply topically at bedtime.  ? [DISCONTINUED] cephALEXin (KEFLEX) 250 MG capsule Take 1 capsule (250 mg total) by mouth daily as needed (immediatley after intercourse). (Patient not taking: Reported on 08/03/2021)  ?  [DISCONTINUED] Fluocinolone Acetonide Body (DERMA-SMOOTHE/FS BODY) 0.01 % OIL Apply to affected ares on scalp once a day as needed  ? [DISCONTINUED] ketoconazole (NIZORAL) 2 % shampoo Apply 1 application topically 2 (two) times a week. (Patient taking differently: Apply 1 application topically as needed (cerebric dermatitis).)  ? [DISCONTINUED] valACYclovir (VALTREX) 1000 MG tablet Take 1,000 mg by mouth daily. (Patient not taking: Reported on 08/03/2021)  ? ?No facility-administered medications prior to visit.  ? ? ?Review of Systems  ?Constitutional: Negative.   ?HENT: Negative.    ?Eyes: Negative.   ?Respiratory: Negative.    ?Cardiovascular: Negative.   ?Gastrointestinal: Negative.   ?Genitourinary: Negative.   ?Musculoskeletal: Negative.   ?Skin: Negative.   ?Neurological: Negative.   ?Endo/Heme/Allergies: Negative.   ?Psychiatric/Behavioral: Negative.    ? ? ? ? ?   ?Objective:  ? ?  ?BP 132/86 (BP Location: Right Arm, Patient Position: Sitting, Cuff Size: Large)   Pulse 63   Ht '5\' 4"'$  (1.626 m)   Wt 160 lb (72.6 kg)   LMP 10/09/2021 (Approximate)   SpO2 94%   BMI 27.46 kg/m?  ? ?Pt deferred breast exam today  ?Physical Exam ?Constitutional:   ?   General: She is not in acute distress. ?   Appearance: Normal appearance. She is not ill-appearing, toxic-appearing or diaphoretic.  ?HENT:  ?  Head: Normocephalic and atraumatic.  ?   Right Ear: Tympanic membrane, ear canal and external ear normal. There is no impacted cerumen.  ?   Left Ear: Tympanic membrane, ear canal and external ear normal. There is no impacted cerumen.  ?   Nose: No congestion or rhinorrhea.  ?   Mouth/Throat:  ?   Mouth: Mucous membranes are moist.  ?   Pharynx: Oropharynx is clear. No oropharyngeal exudate or posterior oropharyngeal erythema.  ?Eyes:  ?   General: No scleral icterus.    ?   Right eye: No discharge.     ?   Left eye: No discharge.  ?   Extraocular Movements: Extraocular movements intact.  ?   Conjunctiva/sclera:  Conjunctivae normal.  ?   Pupils: Pupils are equal, round, and reactive to light.  ?Neck:  ?   Vascular: No carotid bruit.  ?Cardiovascular:  ?   Rate and Rhythm: Normal rate and regular rhythm.  ?   Pulses: Normal pulses.  ?   Heart sounds: Normal heart sounds. No murmur heard. ?  No friction rub. No gallop.  ?Pulmonary:  ?   Effort: Pulmonary effort is normal. No respiratory distress.  ?   Breath sounds: Normal breath sounds. No stridor. No wheezing, rhonchi or rales.  ?Chest:  ?   Chest wall: No tenderness.  ?Abdominal:  ?   General: Abdomen is flat. There is no distension.  ?   Palpations: Abdomen is soft. There is no mass.  ?   Tenderness: There is no abdominal tenderness. There is no right CVA tenderness, left CVA tenderness, guarding or rebound.  ?   Hernia: No hernia is present.  ?Musculoskeletal:     ?   General: No swelling, tenderness, deformity or signs of injury. Normal range of motion.  ?   Cervical back: Normal range of motion and neck supple. No rigidity or tenderness.  ?   Right lower leg: No edema.  ?   Left lower leg: No edema.  ?Lymphadenopathy:  ?   Cervical: No cervical adenopathy.  ?Skin: ?   General: Skin is warm and dry.  ?   Capillary Refill: Capillary refill takes less than 2 seconds.  ?   Coloration: Skin is not jaundiced or pale.  ?   Findings: Lesion present. No bruising, erythema or rash.  ?   Comments: One Well rounded brown colored  mole on the face  ?Neurological:  ?   General: No focal deficit present.  ?   Mental Status: She is alert and oriented to person, place, and time.  ?   Cranial Nerves: No cranial nerve deficit.  ?   Sensory: No sensory deficit.  ?   Motor: No weakness.  ?   Coordination: Coordination normal.  ?   Gait: Gait normal.  ?   Deep Tendon Reflexes: Reflexes normal.  ?Psychiatric:     ?   Mood and Affect: Mood normal.     ?   Behavior: Behavior normal.     ?   Thought Content: Thought content normal.     ?   Judgment: Judgment normal.  ?  ? ?No results found for  any visits on 10/18/21. ? ?   ?Assessment & Plan:  ?  ?Routine Health Maintenance and Physical Exam ? ?Immunization History  ?Administered Date(s) Administered  ? Influenza Split 04/01/2013  ? Influenza-Unspecified 03/02/2014, 03/24/2015, 04/13/2016, 04/08/2017, 04/21/2018, 04/13/2019, 04/12/2020, 04/11/2021  ? PFIZER(Purple Top)SARS-COV-2 Vaccination 07/11/2019, 07/30/2019, 06/21/2020  ?  Tdap 09/04/2005, 06/04/2016  ? ? ?Health Maintenance  ?Topic Date Due  ? COVID-19 Vaccine (4 - Booster for Nelson series) 10/29/2021 (Originally 08/16/2020)  ? INFLUENZA VACCINE  01/30/2022  ? PAP SMEAR-Modifier  10/12/2023  ? TETANUS/TDAP  06/04/2026  ? COLONOSCOPY (Pts 45-25yr Insurance coverage will need to be confirmed)  12/08/2030  ? Hepatitis C Screening  Completed  ? HIV Screening  Completed  ? HPV VACCINES  Aged Out  ? ? ?Discussed health benefits of physical activity, and encouraged her to engage in regular exercise appropriate for her age and condition. ? ?Problem List Items Addressed This Visit   ? ?  ? Musculoskeletal and Integument  ? Skin mole  ?  Appears normal  ?Denies changes in the mole ?Pt will Monitor for changes and report  ? ?  ?  ?  ? Other  ? Prediabetes  ?  Lab Results  ?Component Value Date  ? HGBA1C 5.8 (H) 10/11/2021  ?Avoid sugar sweets, juice , engage in daily excercises ?  ?  ? Overweight (BMI 25.0-29.9)  ?  Wt Readings from Last 3 Encounters:  ?10/18/21 160 lb (72.6 kg)  ?08/03/21 163 lb (73.9 kg)  ?03/16/21 161 lb 6.4 oz (73.2 kg)  ?has lost 3lb since last visit , does not exercise regularly.  ?Need to increase intake of whole foods consisting of vegetables, protein, less carbohydrate, regularly 64 ounces of water daily, engage in regular moderate exercises at least 150 minutes weekly discussed with patient.  Verbalized understanding ?  ?  ? Annual physical exam - Primary  ?  Annual exam as documented.  ?Counseling done include healthy lifestyle involving committing to 150 minutes of exercise per  week, heart healthy diet, and attaining healthy weight. The importance of adequate sleep also discussed.  ?Regular use of seat belt and home safety were also discussed . ?Changes in health habits are decided on by patient

## 2022-02-02 ENCOUNTER — Other Ambulatory Visit (HOSPITAL_COMMUNITY): Payer: Self-pay

## 2022-03-06 ENCOUNTER — Other Ambulatory Visit (HOSPITAL_COMMUNITY): Payer: Self-pay

## 2022-03-22 ENCOUNTER — Telehealth: Payer: 59 | Admitting: Physician Assistant

## 2022-03-22 ENCOUNTER — Encounter: Payer: Self-pay | Admitting: Nurse Practitioner

## 2022-03-22 DIAGNOSIS — J069 Acute upper respiratory infection, unspecified: Secondary | ICD-10-CM

## 2022-03-22 MED ORDER — BENZONATATE 100 MG PO CAPS
100.0000 mg | ORAL_CAPSULE | Freq: Three times a day (TID) | ORAL | 0 refills | Status: DC | PRN
Start: 2022-03-22 — End: 2023-05-01

## 2022-03-22 MED ORDER — FLUTICASONE PROPIONATE 50 MCG/ACT NA SUSP: 2.0000 | Freq: Every day | NASAL | 0 refills | Status: DC

## 2022-03-22 NOTE — Progress Notes (Signed)
I have spent 5 minutes in review of e-visit questionnaire, review and updating patient chart, medical decision making and response to patient.   Imagene Boss Cody Elysse Polidore, PA-C    

## 2022-03-22 NOTE — Progress Notes (Signed)

## 2022-07-20 ENCOUNTER — Other Ambulatory Visit: Payer: Self-pay | Admitting: Family Medicine

## 2022-07-20 DIAGNOSIS — Z1231 Encounter for screening mammogram for malignant neoplasm of breast: Secondary | ICD-10-CM

## 2022-07-30 DIAGNOSIS — H52223 Regular astigmatism, bilateral: Secondary | ICD-10-CM | POA: Diagnosis not present

## 2022-09-06 ENCOUNTER — Ambulatory Visit
Admission: RE | Admit: 2022-09-06 | Discharge: 2022-09-06 | Disposition: A | Discharge status: Home / Self Care | Payer: Commercial Managed Care - PPO | Source: Ambulatory Visit | Attending: Family Medicine | Admitting: Family Medicine

## 2022-09-06 DIAGNOSIS — Z1231 Encounter for screening mammogram for malignant neoplasm of breast: Secondary | ICD-10-CM | POA: Diagnosis not present

## 2022-10-07 ENCOUNTER — Telehealth: Payer: Commercial Managed Care - PPO | Admitting: Family

## 2022-10-07 DIAGNOSIS — R399 Unspecified symptoms and signs involving the genitourinary system: Secondary | ICD-10-CM

## 2022-10-07 MED ORDER — CEPHALEXIN 500 MG PO CAPS: 500.0000 mg | ORAL_CAPSULE | Freq: Two times a day (BID) | ORAL | 0 refills | Status: DC

## 2022-10-07 NOTE — Progress Notes (Signed)

## 2022-10-23 ENCOUNTER — Encounter: Payer: 59 | Admitting: Nurse Practitioner

## 2022-10-23 ENCOUNTER — Encounter: Payer: Self-pay | Admitting: Family Medicine

## 2022-10-23 ENCOUNTER — Ambulatory Visit (INDEPENDENT_AMBULATORY_CARE_PROVIDER_SITE_OTHER): Payer: Commercial Managed Care - PPO | Admitting: Family Medicine

## 2022-10-23 VITALS — BP 148/91 | HR 61 | Ht 64.0 in | Wt 164.0 lb

## 2022-10-23 DIAGNOSIS — E038 Other specified hypothyroidism: Secondary | ICD-10-CM | POA: Diagnosis not present

## 2022-10-23 DIAGNOSIS — Z0001 Encounter for general adult medical examination with abnormal findings: Secondary | ICD-10-CM | POA: Diagnosis not present

## 2022-10-23 DIAGNOSIS — R030 Elevated blood-pressure reading, without diagnosis of hypertension: Secondary | ICD-10-CM | POA: Diagnosis not present

## 2022-10-23 DIAGNOSIS — R7301 Impaired fasting glucose: Secondary | ICD-10-CM

## 2022-10-23 DIAGNOSIS — E7849 Other hyperlipidemia: Secondary | ICD-10-CM | POA: Diagnosis not present

## 2022-10-23 DIAGNOSIS — E559 Vitamin D deficiency, unspecified: Secondary | ICD-10-CM | POA: Diagnosis not present

## 2022-10-23 NOTE — Progress Notes (Deleted)
 Complete physical exam  Patient: Jennifer Hansen   DOB: 08/16/1975   47 y.o. Female  MRN: [id number]  Subjective:    Chief Complaint  Patient presents with   Annual Exam    Jennifer Hansen is a 47 y.o. female who presents today for a complete physical exam. She reports consuming a general diet.  She reports working at 3 days weekly.  She generally feels well. She reports sleeping well. She does not have additional problems to discuss today.    Most recent fall risk assessment:    10/23/2022    8:43 AM  Fall Risk   Falls in the past year? 0  Number falls in past yr: 0  Injury with Fall? 0  Risk for fall due to : No Fall Risks  Follow up Falls evaluation completed     Most recent depression screenings:    10/23/2022    8:43 AM 10/18/2021    8:29 AM  PHQ 2/9 Scores  PHQ - 2 Score 0 0  PHQ- 9 Score 0     Dental: No current dental problems and Last dental visit: 08/2022  Patient Active Problem List   Diagnosis Date Noted   Elevated BP without diagnosis of hypertension 10/23/2022   Encounter for annual general medical examination with abnormal findings in adult 10/18/2021   Skin mole 10/18/2021   Overweight (BMI 25.0-29.9) 08/03/2021   Prediabetes 10/12/2020   Dermoid cyst of leg, left 10/11/2020   Bunion 11/18/2015   Past Medical History:  Diagnosis Date   Acne    Past Surgical History:  Procedure Laterality Date   BUNIONECTOMY     COLONOSCOPY N/A 12/07/2020   Procedure: COLONOSCOPY;  Surgeon: Rourk, Robert M, MD;  Location: [facility];  Service: Endoscopy;  Laterality: N/A;  ASA I / 2:00   TUBAL LIGATION  2004   Social History   Tobacco Use   Smoking status: Never   Smokeless tobacco: Never  Vaping Use   Vaping Use: Never used  Substance Use Topics   Alcohol use: No   Drug use: No   Social History   Socioeconomic History   Marital status: Widowed    Spouse name: Not on file   Number of children: 2   Years of education: Not on file   Highest  education level: Not on file  Occupational History   Not on file  Tobacco Use   Smoking status: Never   Smokeless tobacco: Never  Vaping Use   Vaping Use: Never used  Substance and Sexual Activity   Alcohol use: No   Drug use: No   Sexual activity: Not Currently    Birth control/protection: Surgical    Comment: tubal  Other Topics Concern   Not on file  Social History Narrative   Lives with her daughter. 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    Social Determinants of Health   Financial Resource Strain: Not on file  Food Insecurity: Not on file  Transportation Needs: Not on file  Physical Activity: Not on file  Stress: Not on file  Social Connections: Not on file  Intimate Partner Violence: Not on file   Family Status  Relation Name Status   Mother  Alive   Father  Alive   Brother  Alive   Daughter  Alive   Son  Alive   MGM  Deceased at age 60   MGF  Deceased at age 75   PGM  Deceased at age 84     PGF  Deceased at age 77   Neg Hx  (Not Specified)   Family History  Problem Relation Age of Onset   Arthritis Mother    Diabetes Father    Cancer Maternal Grandmother        colon cancer   Diabetes Maternal Grandmother    Heart disease Maternal Grandmother    Cancer Maternal Grandfather    Aneurysm Paternal Grandfather    Breast cancer Neg Hx    Lung cancer Neg Hx    Cancer - Cervical Neg Hx    No Known Allergies    Patient Care Team: Cliffie Gingras, FNP as PCP - General (Family Medicine) Rourk, Robert M, MD as Consulting Physician (Gastroenterology)   Outpatient Medications Prior to Visit  Medication Sig   aluminum chloride (DRYSOL) 20 % external solution Apply 1 application topically at bedtime.   Ascorbic Acid (VITAMIN C) 1000 MG tablet Take 1,000 mg by mouth daily.   benzonatate (TESSALON) 100 MG capsule Take 1 capsule (100 mg total) by mouth 3 (three) times daily as needed for cough.   Biotin 5 MG CAPS Take 5 mg by mouth daily.   cephALEXin (KEFLEX) 500 MG  capsule Take 1 capsule (500 mg total) by mouth 2 (two) times daily.   Cholecalciferol (VITAMIN D) 125 MCG (5000 UT) CAPS Take 5,000 Units by mouth daily.   fluticasone (FLONASE) 50 MCG/ACT nasal spray Place 2 sprays into both nostrils daily.   Multiple Vitamin (MULTIVITAMIN WITH MINERALS) TABS tablet Take 1 tablet by mouth daily.   triamcinolone cream (KENALOG) 0.1 % Apply 1 application topically as needed (cerebric dermatitis).   No facility-administered medications prior to visit.    Review of Systems  Constitutional:  Negative for chills, fever and malaise/fatigue.  HENT:  Negative for congestion and sinus pain.   Eyes:  Negative for pain, discharge and redness.  Respiratory:  Negative for cough, sputum production and shortness of breath.   Cardiovascular:  Negative for chest pain, palpitations, claudication and leg swelling.  Gastrointestinal:  Negative for diarrhea, heartburn and nausea.  Genitourinary:  Negative for flank pain and frequency.  Musculoskeletal:  Negative for back pain and joint pain.  Skin:  Negative for itching.  Neurological:  Negative for dizziness, seizures and headaches.  Endo/Heme/Allergies:  Negative for environmental allergies.  Psychiatric/Behavioral:  Negative for memory loss. The patient does not have insomnia.        Objective:    BP (!) 148/91   Pulse 61   Ht 5' 4" (1.626 m)   Wt 164 lb 0.6 oz (74.4 kg)   SpO2 98%   BMI 28.16 kg/m  BP Readings from Last 3 Encounters:  10/23/22 (!) 148/91  10/18/21 132/86  08/03/21 108/62   Wt Readings from Last 3 Encounters:  10/23/22 164 lb 0.6 oz (74.4 kg)  10/18/21 160 lb (72.6 kg)  08/03/21 163 lb (73.9 kg)      Physical Exam HENT:     Head: Normocephalic.     Left Ear: External ear normal.     Mouth/Throat:     Mouth: Mucous membranes are moist.  Eyes:     Extraocular Movements: Extraocular movements intact.     Pupils: Pupils are equal, round, and reactive to light.  Cardiovascular:      Rate and Rhythm: Normal rate and regular rhythm.     Heart sounds: No murmur heard. Pulmonary:     Effort: Pulmonary effort is normal.     Breath sounds: Normal breath sounds.    Abdominal:     Palpations: Abdomen is soft.     Tenderness: There is no right CVA tenderness or left CVA tenderness.  Musculoskeletal:     Right lower leg: No edema.     Left lower leg: No edema.  Skin:    Findings: No lesion or rash.  Neurological:     Mental Status: She is alert and oriented to person, place, and time.     GCS: GCS eye subscore is 4. GCS verbal subscore is 5. GCS motor subscore is 6.     Cranial Nerves: No facial asymmetry.     Sensory: No sensory deficit.     Motor: No weakness.     Coordination: Coordination normal. Finger-Nose-Finger Test normal.     Gait: Gait normal.  Psychiatric:        Judgment: Judgment normal.     No results found for any visits on 10/23/22. Last CBC Lab Results  Component Value Date   WBC 5.1 10/11/2021   HGB 12.8 10/11/2021   HCT 38.6 10/11/2021   MCV 94 10/11/2021   MCH 31.0 10/11/2021   RDW 12.7 10/11/2021   PLT 284 10/11/2021   Last metabolic panel Lab Results  Component Value Date   GLUCOSE 99 10/11/2021   NA 137 10/11/2021   K 4.2 10/11/2021   CL 103 10/11/2021   CO2 25 10/11/2021   BUN 10 10/11/2021   CREATININE 0.93 10/11/2021   EGFR 77 10/11/2021   CALCIUM 9.1 10/11/2021   PROT 7.2 10/11/2021   ALBUMIN 4.3 10/11/2021   LABGLOB 2.9 10/11/2021   AGRATIO 1.5 10/11/2021   BILITOT <0.2 10/11/2021   ALKPHOS 60 10/11/2021   AST 17 10/11/2021   ALT 15 10/11/2021   Last lipids Lab Results  Component Value Date   CHOL 193 10/11/2021   HDL 55 10/11/2021   LDLCALC 122 (H) 10/11/2021   TRIG 88 10/11/2021   CHOLHDL 3.5 10/11/2021   Last hemoglobin A1c Lab Results  Component Value Date   HGBA1C 5.8 (H) 10/11/2021   Last thyroid functions Lab Results  Component Value Date   TSH 1.320 10/11/2021   Last vitamin D Lab Results   Component Value Date   VD25OH 33.2 10/11/2021   Last vitamin B12 and Folate Lab Results  Component Value Date   VITAMINB12 433 10/11/2020   FOLATE 12.5 10/11/2020        Assessment & Plan:    Routine Health Maintenance and Physical Exam  Immunization History  Administered Date(s) Administered   Influenza Split 04/01/2013   Influenza-Unspecified 03/02/2014, 03/24/2015, 04/13/2016, 04/08/2017, 04/21/2018, 04/13/2019, 04/12/2020, 04/11/2021   PFIZER(Purple Top)SARS-COV-2 Vaccination 07/11/2019, 07/30/2019, 06/21/2020   Tdap 09/04/2005, 06/04/2016    Health Maintenance  Topic Date Due   COVID-19 Vaccine (4 - 2023-24 season) 03/02/2022   INFLUENZA VACCINE  01/31/2023   PAP SMEAR-Modifier  10/12/2023   DTaP/Tdap/Td (3 - Td or Tdap) 06/04/2026   COLONOSCOPY (Pts 45-49yrs Insurance coverage will need to be confirmed)  12/08/2030   Hepatitis C Screening  Completed   HIV Screening  Completed   HPV VACCINES  Aged Out    Discussed health benefits of physical activity, and encouraged her to engage in regular exercise appropriate for her age and condition.  Encounter for annual general medical examination with abnormal findings in adult Assessment & Plan: Physical exam as documented Discussed heart-healthy diet  Encouraged to Exercise: If you are able: 30 -60 minutes a day ,4 days a week, or 150 minutes a week.   The longer the better. Combine stretch, strength, and aerobic activities Encourage to eat whole Food, Plant Predominant Nutrition is highly recommended: Eat Plenty of vegetables, Mushrooms, fruits, Legumes, Whole Grains, Nuts, seeds in lieu of processed meats, processed snacks/pastries red meat, poultry, eggs.  Will f/u in 1 year for CPE      Elevated BP without diagnosis of hypertension Assessment & Plan: Uncontrolled Patient is asymptomatic today in the clinic Low-sodium diet and increase physical activity encourage We will follow-up on BP in 2 weeks BP Readings  from Last 3 Encounters:  10/23/22 (!) 148/91  10/18/21 132/86  08/03/21 108/62      IFG (impaired fasting glucose) -     Hemoglobin A1c  Vitamin D deficiency -     VITAMIN D 25 Hydroxy (Vit-D Deficiency, Fractures)  Other specified hypothyroidism -     TSH + free T4  Other hyperlipidemia -     Lipid panel -     CMP14+EGFR -     CBC with Differential/Platelet    Return in about 1 year (around 10/23/2023) for blood pressure, CPE.     Deundra Furber, FNP   

## 2022-10-23 NOTE — Progress Notes (Signed)
Complete physical exam  Patient: Jennifer Hansen   DOB: 08/21/1975   47 y.o. Female  MRN: [id number]  Subjective:    Chief Complaint  Patient presents with   Annual Exam    Michelina Mexicano is a 47 y.o. female who presents today for a complete physical exam. She reports consuming a general diet.  She reports working at 3 days weekly.  She generally feels well. She reports sleeping well. She does not have additional problems to discuss today.    Most recent fall risk assessment:    10/23/2022    8:43 AM  Fall Risk   Falls in the past year? 0  Number falls in past yr: 0  Injury with Fall? 0  Risk for fall due to : No Fall Risks  Follow up Falls evaluation completed     Most recent depression screenings:    10/23/2022    8:43 AM 10/18/2021    8:29 AM  PHQ 2/9 Scores  PHQ - 2 Score 0 0  PHQ- 9 Score 0     Dental: No current dental problems and Last dental visit: 08/2022  Patient Active Problem List   Diagnosis Date Noted   Elevated BP without diagnosis of hypertension 10/23/2022   Encounter for annual general medical examination with abnormal findings in adult 10/18/2021   Skin mole 10/18/2021   Overweight (BMI 25.0-29.9) 08/03/2021   Prediabetes 10/12/2020   Dermoid cyst of leg, left 10/11/2020   Bunion 11/18/2015   Past Medical History:  Diagnosis Date   Acne    Past Surgical History:  Procedure Laterality Date   BUNIONECTOMY     COLONOSCOPY N/A 12/07/2020   Procedure: COLONOSCOPY;  Surgeon: Corbin Ade, MD;  Location: [facility];  Service: Endoscopy;  Laterality: N/A;  ASA I / 2:00   TUBAL LIGATION  2004   Social History   Tobacco Use   Smoking status: Never   Smokeless tobacco: Never  Vaping Use   Vaping Use: Never used  Substance Use Topics   Alcohol use: No   Drug use: No   Social History   Socioeconomic History   Marital status: Widowed    Spouse name: Not on file   Number of children: 2   Years of education: Not on file   Highest  education level: Not on file  Occupational History   Not on file  Tobacco Use   Smoking status: Never   Smokeless tobacco: Never  Vaping Use   Vaping Use: Never used  Substance and Sexual Activity   Alcohol use: No   Drug use: No   Sexual activity: Not Currently    Birth control/protection: Surgical    Comment: tubal  Other Topics Concern   Not on file  Social History Narrative   Lives with her daughter. Works in Research scientist (medical) at [employer].    Social Determinants of Health   Financial Resource Strain: Not on file  Food Insecurity: Not on file  Transportation Needs: Not on file  Physical Activity: Not on file  Stress: Not on file  Social Connections: Not on file  Intimate Partner Violence: Not on file   Family Status  Relation Name Status   Mother  Alive   Father  Alive   Brother  Alive   Daughter  Alive   Son  Alive   MGM  Deceased at age 62   MGF  Deceased at age 66   PGM  Deceased at age 56  PGF  Deceased at age 84   Neg Hx  (Not Specified)   Family History  Problem Relation Age of Onset   Arthritis Mother    Diabetes Father    Cancer Maternal Grandmother        colon cancer   Diabetes Maternal Grandmother    Heart disease Maternal Grandmother    Cancer Maternal Grandfather    Aneurysm Paternal Grandfather    Breast cancer Neg Hx    Lung cancer Neg Hx    Cancer - Cervical Neg Hx    No Known Allergies    Patient Care Team: Gilmore Laroche, FNP as PCP - General (Family Medicine) Jena Gauss, Gerrit Friends, MD as Consulting Physician (Gastroenterology)   Outpatient Medications Prior to Visit  Medication Sig   aluminum chloride (DRYSOL) 20 % external solution Apply 1 application topically at bedtime.   Ascorbic Acid (VITAMIN C) 1000 MG tablet Take 1,000 mg by mouth daily.   benzonatate (TESSALON) 100 MG capsule Take 1 capsule (100 mg total) by mouth 3 (three) times daily as needed for cough.   Biotin 5 MG CAPS Take 5 mg by mouth daily.   cephALEXin (KEFLEX) 500 MG  capsule Take 1 capsule (500 mg total) by mouth 2 (two) times daily.   Cholecalciferol (VITAMIN D) 125 MCG (5000 UT) CAPS Take 5,000 Units by mouth daily.   fluticasone (FLONASE) 50 MCG/ACT nasal spray Place 2 sprays into both nostrils daily.   Multiple Vitamin (MULTIVITAMIN WITH MINERALS) TABS tablet Take 1 tablet by mouth daily.   triamcinolone cream (KENALOG) 0.1 % Apply 1 application topically as needed (cerebric dermatitis).   No facility-administered medications prior to visit.    Review of Systems  Constitutional:  Negative for chills, fever and malaise/fatigue.  HENT:  Negative for congestion and sinus pain.   Eyes:  Negative for pain, discharge and redness.  Respiratory:  Negative for cough, sputum production and shortness of breath.   Cardiovascular:  Negative for chest pain, palpitations, claudication and leg swelling.  Gastrointestinal:  Negative for diarrhea, heartburn and nausea.  Genitourinary:  Negative for flank pain and frequency.  Musculoskeletal:  Negative for back pain and joint pain.  Skin:  Negative for itching.  Neurological:  Negative for dizziness, seizures and headaches.  Endo/Heme/Allergies:  Negative for environmental allergies.  Psychiatric/Behavioral:  Negative for memory loss. The patient does not have insomnia.        Objective:    BP (!) 148/91   Pulse 61   Ht 5\' 4"  (1.626 m)   Wt 164 lb 0.6 oz (74.4 kg)   SpO2 98%   BMI 28.16 kg/m  BP Readings from Last 3 Encounters:  10/23/22 (!) 148/91  10/18/21 132/86  08/03/21 108/62   Wt Readings from Last 3 Encounters:  10/23/22 164 lb 0.6 oz (74.4 kg)  10/18/21 160 lb (72.6 kg)  08/03/21 163 lb (73.9 kg)      Physical Exam HENT:     Head: Normocephalic.     Left Ear: External ear normal.     Mouth/Throat:     Mouth: Mucous membranes are moist.  Eyes:     Extraocular Movements: Extraocular movements intact.     Pupils: Pupils are equal, round, and reactive to light.  Cardiovascular:      Rate and Rhythm: Normal rate and regular rhythm.     Heart sounds: No murmur heard. Pulmonary:     Effort: Pulmonary effort is normal.     Breath sounds: Normal breath sounds.  Abdominal:     Palpations: Abdomen is soft.     Tenderness: There is no right CVA tenderness or left CVA tenderness.  Musculoskeletal:     Right lower leg: No edema.     Left lower leg: No edema.  Skin:    Findings: No lesion or rash.  Neurological:     Mental Status: She is alert and oriented to person, place, and time.     GCS: GCS eye subscore is 4. GCS verbal subscore is 5. GCS motor subscore is 6.     Cranial Nerves: No facial asymmetry.     Sensory: No sensory deficit.     Motor: No weakness.     Coordination: Coordination normal. Finger-Nose-Finger Test normal.     Gait: Gait normal.  Psychiatric:        Judgment: Judgment normal.     No results found for any visits on 10/23/22. Last CBC Lab Results  Component Value Date   WBC 5.1 10/11/2021   HGB 12.8 10/11/2021   HCT 38.6 10/11/2021   MCV 94 10/11/2021   MCH 31.0 10/11/2021   RDW 12.7 10/11/2021   PLT 284 10/11/2021   Last metabolic panel Lab Results  Component Value Date   GLUCOSE 99 10/11/2021   NA 137 10/11/2021   K 4.2 10/11/2021   CL 103 10/11/2021   CO2 25 10/11/2021   BUN 10 10/11/2021   CREATININE 0.93 10/11/2021   EGFR 77 10/11/2021   CALCIUM 9.1 10/11/2021   PROT 7.2 10/11/2021   ALBUMIN 4.3 10/11/2021   LABGLOB 2.9 10/11/2021   AGRATIO 1.5 10/11/2021   BILITOT <0.2 10/11/2021   ALKPHOS 60 10/11/2021   AST 17 10/11/2021   ALT 15 10/11/2021   Last lipids Lab Results  Component Value Date   CHOL 193 10/11/2021   HDL 55 10/11/2021   LDLCALC 122 (H) 10/11/2021   TRIG 88 10/11/2021   CHOLHDL 3.5 10/11/2021   Last hemoglobin A1c Lab Results  Component Value Date   HGBA1C 5.8 (H) 10/11/2021   Last thyroid functions Lab Results  Component Value Date   TSH 1.320 10/11/2021   Last vitamin D Lab Results   Component Value Date   VD25OH 33.2 10/11/2021   Last vitamin B12 and Folate Lab Results  Component Value Date   VITAMINB12 433 10/11/2020   FOLATE 12.5 10/11/2020        Assessment & Plan:    Routine Health Maintenance and Physical Exam  Immunization History  Administered Date(s) Administered   Influenza Split 04/01/2013   Influenza-Unspecified 03/02/2014, 03/24/2015, 04/13/2016, 04/08/2017, 04/21/2018, 04/13/2019, 04/12/2020, 04/11/2021   PFIZER(Purple Top)SARS-COV-2 Vaccination 07/11/2019, 07/30/2019, 06/21/2020   Tdap 09/04/2005, 06/04/2016    Health Maintenance  Topic Date Due   COVID-19 Vaccine (4 - 2023-24 season) 03/02/2022   INFLUENZA VACCINE  01/31/2023   PAP SMEAR-Modifier  10/12/2023   DTaP/Tdap/Td (3 - Td or Tdap) 06/04/2026   COLONOSCOPY (Pts 45-94yrs Insurance coverage will need to be confirmed)  12/08/2030   Hepatitis C Screening  Completed   HIV Screening  Completed   HPV VACCINES  Aged Out    Discussed health benefits of physical activity, and encouraged her to engage in regular exercise appropriate for her age and condition.  Encounter for annual general medical examination with abnormal findings in adult Assessment & Plan: Physical exam as documented Discussed heart-healthy diet  Encouraged to Exercise: If you are able: 30 -60 minutes a day ,4 days a week, or 150 minutes a week.  The longer the better. Combine stretch, strength, and aerobic activities Encourage to eat whole Food, Plant Predominant Nutrition is highly recommended: Eat Plenty of vegetables, Mushrooms, fruits, Legumes, Whole Grains, Nuts, seeds in lieu of processed meats, processed snacks/pastries red meat, poultry, eggs.  Will f/u in 1 year for CPE      Elevated BP without diagnosis of hypertension Assessment & Plan: Uncontrolled Patient is asymptomatic today in the clinic Low-sodium diet and increase physical activity encourage We will follow-up on BP in 2 weeks BP Readings  from Last 3 Encounters:  10/23/22 (!) 148/91  10/18/21 132/86  08/03/21 108/62      IFG (impaired fasting glucose) -     Hemoglobin A1c  Vitamin D deficiency -     VITAMIN D 25 Hydroxy (Vit-D Deficiency, Fractures)  Other specified hypothyroidism -     TSH + free T4  Other hyperlipidemia -     Lipid panel -     CMP14+EGFR -     CBC with Differential/Platelet    Return in about 1 year (around 10/23/2023) for blood pressure, CPE.     Gilmore Laroche, FNP

## 2022-10-23 NOTE — Patient Instructions (Signed)
I appreciate the opportunity to provide care to you today!    Follow up:  2 weeks for BP and 1 year for CPE  Labs: please stop by the lab today to get your blood drawn (CBC, CMP, TSH, Lipid profile, HgA1c, Vit D)  Physical activity helps: Lower your blood glucose, improve your heart health, lower your blood pressure and cholesterol, burn calories to help manage her weight, gave you energy, lower stress, and improve his sleep.  The American diabetes Association (ADA) recommends being active for 2-1/2 hours (150 minutes) or more week.  Exercise for 30 minutes, 5 days a week (150 minutes total)   Please continue to a heart-healthy diet and increase your physical activities.     It was a pleasure to see you and I look forward to continuing to work together on your health and well-being. Please do not hesitate to call the office if you need care or have questions about your care.   Have a wonderful day and week. With Gratitude, Gilmore Laroche MSN, FNP-BC'

## 2022-10-23 NOTE — Assessment & Plan Note (Signed)
Uncontrolled Patient is asymptomatic today in the clinic Low-sodium diet and increase physical activity encourage We will follow-up on BP in 2 weeks BP Readings from Last 3 Encounters:  10/23/22 (!) 148/91  10/18/21 132/86  08/03/21 108/62

## 2022-10-23 NOTE — Assessment & Plan Note (Signed)
Physical exam as documented Discussed heart-healthy diet  Encouraged to Exercise: If you are able: 30 -60 minutes a day ,4 days a week, or 150 minutes a week. The longer the better. Combine stretch, strength, and aerobic activities Encourage to eat whole Food, Plant Predominant Nutrition is highly recommended: Eat Plenty of vegetables, Mushrooms, fruits, Legumes, Whole Grains, Nuts, seeds in lieu of processed meats, processed snacks/pastries red meat, poultry, eggs.  Will f/u in 1 year for CPE    

## 2022-10-23 NOTE — Progress Notes (Deleted)
Established Patient Office Visit  Subjective:  Patient ID: Jennifer Hansen, female    DOB: Dec 25, 1975  Age: 47 y.o. MRN: [id number]  CC:  Chief Complaint  Patient presents with   Annual Exam    HPI Jennifer Hansen is a 47 y.o. female with past medical history of *** presents for f/u of *** chronic medical conditions.  Past Medical History:  Diagnosis Date   Acne     Past Surgical History:  Procedure Laterality Date   BUNIONECTOMY     COLONOSCOPY N/A 12/07/2020   Procedure: COLONOSCOPY;  Surgeon: Corbin Ade, MD;  Location: [facility];  Service: Endoscopy;  Laterality: N/A;  ASA I / 2:00   TUBAL LIGATION  2004    Family History  Problem Relation Age of Onset   Arthritis Mother    Diabetes Father    Cancer Maternal Grandmother        colon cancer   Diabetes Maternal Grandmother    Heart disease Maternal Grandmother    Cancer Maternal Grandfather    Aneurysm Paternal Grandfather    Breast cancer Neg Hx    Lung cancer Neg Hx    Cancer - Cervical Neg Hx     Social History   Socioeconomic History   Marital status: Widowed    Spouse name: Not on file   Number of children: 2   Years of education: Not on file   Highest education level: Not on file  Occupational History   Not on file  Tobacco Use   Smoking status: Never   Smokeless tobacco: Never  Vaping Use   Vaping Use: Never used  Substance and Sexual Activity   Alcohol use: No   Drug use: No   Sexual activity: Not Currently    Birth control/protection: Surgical    Comment: tubal  Other Topics Concern   Not on file  Social History Narrative   Lives with her daughter. Works in Research scientist (medical) at [employer].    Social Determinants of Health   Financial Resource Strain: Not on file  Food Insecurity: Not on file  Transportation Needs: Not on file  Physical Activity: Not on file  Stress: Not on file  Social Connections: Not on file  Intimate Partner Violence: Not on file    Outpatient Medications Prior to  Visit  Medication Sig Dispense Refill   aluminum chloride (DRYSOL) 20 % external solution Apply 1 application topically at bedtime. 35 mL 0   Ascorbic Acid (VITAMIN C) 1000 MG tablet Take 1,000 mg by mouth daily.     benzonatate (TESSALON) 100 MG capsule Take 1 capsule (100 mg total) by mouth 3 (three) times daily as needed for cough. 30 capsule 0   Biotin 5 MG CAPS Take 5 mg by mouth daily.     cephALEXin (KEFLEX) 500 MG capsule Take 1 capsule (500 mg total) by mouth 2 (two) times daily. 14 capsule 0   Cholecalciferol (VITAMIN D) 125 MCG (5000 UT) CAPS Take 5,000 Units by mouth daily.     fluticasone (FLONASE) 50 MCG/ACT nasal spray Place 2 sprays into both nostrils daily. 16 g 0   Multiple Vitamin (MULTIVITAMIN WITH MINERALS) TABS tablet Take 1 tablet by mouth daily.     triamcinolone cream (KENALOG) 0.1 % Apply 1 application topically as needed (cerebric dermatitis). 30 g 1   No facility-administered medications prior to visit.    No Known Allergies  ROS Review of Systems    Objective:    Physical  Exam  BP (!) 148/91   Pulse 61   Ht  (1.626 m)   Wt 164 lb 0.6 oz (74.4 kg)   SpO2 98%   BMI 28.16 kg/m  Wt Readings from Last 3 Encounters:  10/23/22 164 lb 0.6 oz (74.4 kg)  10/18/21 160 lb (72.6 kg)  08/03/21 163 lb (73.9 kg)    Lab Results  Component Value Date   TSH 1.320 10/11/2021   Lab Results  Component Value Date   WBC 5.1 10/11/2021   HGB 12.8 10/11/2021   HCT 38.6 10/11/2021   MCV 94 10/11/2021   PLT 284 10/11/2021   Lab Results  Component Value Date   NA 137 10/11/2021   K 4.2 10/11/2021   CO2 25 10/11/2021   GLUCOSE 99 10/11/2021   BUN 10 10/11/2021   CREATININE 0.93 10/11/2021   BILITOT <0.2 10/11/2021   ALKPHOS 60 10/11/2021   AST 17 10/11/2021   ALT 15 10/11/2021   PROT 7.2 10/11/2021   ALBUMIN 4.3 10/11/2021   CALCIUM 9.1 10/11/2021   EGFR 77 10/11/2021   Lab Results  Component Value Date   CHOL 193 10/11/2021   Lab Results   Component Value Date   HDL 55 10/11/2021   Lab Results  Component Value Date   LDLCALC 122 (H) 10/11/2021   Lab Results  Component Value Date   TRIG 88 10/11/2021   Lab Results  Component Value Date   CHOLHDL 3.5 10/11/2021   Lab Results  Component Value Date   HGBA1C 5.8 (H) 10/11/2021      Assessment & Plan:  There are no diagnoses linked to this encounter.  Follow-up: No follow-ups on file.   Gilmore Laroche, FNP

## 2022-10-23 NOTE — Progress Notes (Signed)
Complete physical exam  Patient: Jennifer Hansen   DOB: 1976/06/20   47 y.o. Female  MRN: [id number]  Subjective:    Chief Complaint  Patient presents with   Annual Exam    Jennifer Hansen is a 47 y.o. female who presents today for a complete physical exam. She reports consuming a general diet. Home exercise routine includes treadmill. She generally feels well. She reports sleeping well. She does not have additional problems to discuss today.    Most recent fall risk assessment:    10/23/2022    8:43 AM  Fall Risk   Falls in the past year? 0  Number falls in past yr: 0  Injury with Fall? 0  Risk for fall due to : No Fall Risks  Follow up Falls evaluation completed     Most recent depression screenings:    10/23/2022    8:43 AM 10/18/2021    8:29 AM  PHQ 2/9 Scores  PHQ - 2 Score 0 0  PHQ- 9 Score 0     Dental: No current dental problems and Receives regular dental care  Patient Active Problem List   Diagnosis Date Noted   Elevated BP without diagnosis of hypertension 10/23/2022   Encounter for annual general medical examination with abnormal findings in adult 10/18/2021   Skin mole 10/18/2021   Overweight (BMI 25.0-29.9) 08/03/2021   Prediabetes 10/12/2020   Dermoid cyst of leg, left 10/11/2020   Bunion 11/18/2015   Past Surgical History:  Procedure Laterality Date   BUNIONECTOMY     COLONOSCOPY N/A 12/07/2020   Procedure: COLONOSCOPY;  Surgeon: Corbin Ade, MD;  Location: [facility];  Service: Endoscopy;  Laterality: N/A;  ASA I / 2:00   TUBAL LIGATION  2004   No Known Allergies    Patient Care Team: Gilmore Laroche, FNP as PCP - General (Family Medicine) Jena Gauss, Gerrit Friends, MD as Consulting Physician (Gastroenterology)   Outpatient Medications Prior to Visit  Medication Sig   aluminum chloride (DRYSOL) 20 % external solution Apply 1 application topically at bedtime.   Ascorbic Acid (VITAMIN C) 1000 MG tablet Take 1,000 mg by mouth daily.    benzonatate (TESSALON) 100 MG capsule Take 1 capsule (100 mg total) by mouth 3 (three) times daily as needed for cough.   Biotin 5 MG CAPS Take 5 mg by mouth daily.   cephALEXin (KEFLEX) 500 MG capsule Take 1 capsule (500 mg total) by mouth 2 (two) times daily.   Cholecalciferol (VITAMIN D) 125 MCG (5000 UT) CAPS Take 5,000 Units by mouth daily.   fluticasone (FLONASE) 50 MCG/ACT nasal spray Place 2 sprays into both nostrils daily.   Multiple Vitamin (MULTIVITAMIN WITH MINERALS) TABS tablet Take 1 tablet by mouth daily.   triamcinolone cream (KENALOG) 0.1 % Apply 1 application topically as needed (cerebric dermatitis).   No facility-administered medications prior to visit.    Review of Systems  Constitutional: Negative.   HENT: Negative.    Eyes: Negative.   Respiratory: Negative.    Cardiovascular: Negative.   Gastrointestinal: Negative.   Genitourinary: Negative.   Musculoskeletal: Negative.   Skin:  Positive for itching.       Reports intermittent itching on lower extremities for the last year. Patient has seen dermatologist in the past, with no abnormal findings  Reports using topical cream for relief, pt unsure of name Will continue to monitor  Neurological: Negative.   Endo/Heme/Allergies: Negative.   Psychiatric/Behavioral: Negative.  Objective:     BP (!) 148/91   Pulse 61   Ht  (1.626 m)   Wt 164 lb 0.6 oz (74.4 kg)   SpO2 98%   BMI 28.16 kg/m  BP Readings from Last 3 Encounters:  10/23/22 (!) 148/91  10/18/21 132/86  08/03/21 108/62      Physical Exam Vitals reviewed.  Constitutional:      Appearance: Normal appearance. She is normal weight.  HENT:     Head: Normocephalic.     Nose: Nose normal.     Mouth/Throat:     Mouth: Mucous membranes are moist.  Eyes:     Extraocular Movements: Extraocular movements intact.     Conjunctiva/sclera: Conjunctivae normal.     Pupils: Pupils are equal, round, and reactive to light.   Cardiovascular:     Rate and Rhythm: Normal rate and regular rhythm.     Pulses: Normal pulses.     Heart sounds: Normal heart sounds.  Pulmonary:     Effort: Pulmonary effort is normal.     Breath sounds: Normal breath sounds.  Abdominal:     General: Bowel sounds are normal.     Palpations: Abdomen is soft.  Musculoskeletal:        General: Normal range of motion.     Cervical back: Normal range of motion.  Skin:    General: Skin is warm and dry.     Capillary Refill: Capillary refill takes less than 2 seconds.  Neurological:     General: No focal deficit present.     Mental Status: She is alert.  Psychiatric:        Mood and Affect: Mood normal.        Behavior: Behavior normal.        Thought Content: Thought content normal.      No results found for any visits on 10/23/22. Last CBC Lab Results  Component Value Date   WBC 5.1 10/11/2021   HGB 12.8 10/11/2021   HCT 38.6 10/11/2021   MCV 94 10/11/2021   MCH 31.0 10/11/2021   RDW 12.7 10/11/2021   PLT 284 10/11/2021   Last metabolic panel Lab Results  Component Value Date   GLUCOSE 99 10/11/2021   NA 137 10/11/2021   K 4.2 10/11/2021   CL 103 10/11/2021   CO2 25 10/11/2021   BUN 10 10/11/2021   CREATININE 0.93 10/11/2021   EGFR 77 10/11/2021   CALCIUM 9.1 10/11/2021   PROT 7.2 10/11/2021   ALBUMIN 4.3 10/11/2021   LABGLOB 2.9 10/11/2021   AGRATIO 1.5 10/11/2021   BILITOT <0.2 10/11/2021   ALKPHOS 60 10/11/2021   AST 17 10/11/2021   ALT 15 10/11/2021        Assessment & Plan:    Routine Health Maintenance and Physical Exam  Immunization History  Administered Date(s) Administered   Influenza Split 04/01/2013   Influenza-Unspecified 03/02/2014, 03/24/2015, 04/13/2016, 04/08/2017, 04/21/2018, 04/13/2019, 04/12/2020, 04/11/2021   PFIZER(Purple Top)SARS-COV-2 Vaccination 07/11/2019, 07/30/2019, 06/21/2020   Tdap 09/04/2005, 06/04/2016    Health Maintenance  Topic Date Due   COVID-19 Vaccine (4 -  2023-24 season) 03/02/2022   INFLUENZA VACCINE  01/31/2023   PAP SMEAR-Modifier  10/12/2023   DTaP/Tdap/Td (3 - Td or Tdap) 06/04/2026   COLONOSCOPY (Pts 45-12yrs Insurance coverage will need to be confirmed)  12/08/2030   Hepatitis C Screening  Completed   HIV Screening  Completed   HPV VACCINES  Aged Out    Discussed health benefits of physical  activity, and encouraged her to engage in regular exercise appropriate for her age and condition.  Problem List Items Addressed This Visit       Other   Encounter for annual general medical examination with abnormal findings in adult - Primary    Physical exam as documented Discussed heart-healthy diet  Encouraged to Exercise: If you are able: 30 -60 minutes a day ,4 days a week, or 150 minutes a week. The longer the better. Combine stretch, strength, and aerobic activities Encourage to eat whole Food, Plant Predominant Nutrition is highly recommended: Eat Plenty of vegetables, Mushrooms, fruits, Legumes, Whole Grains, Nuts, seeds in lieu of processed meats, processed snacks/pastries red meat, poultry, eggs.  Will f/u in 1 year for CPE         Elevated BP without diagnosis of hypertension   Other Visit Diagnoses     IFG (impaired fasting glucose)       Relevant Orders   Hemoglobin A1c   Vitamin D deficiency       Relevant Orders   VITAMIN D 25 Hydroxy (Vit-D Deficiency, Fractures)   Other specified hypothyroidism       Relevant Orders   TSH + free T4   Other hyperlipidemia       Relevant Orders   Lipid panel   CMP14+EGFR   CBC with Differential/Platelet      Return in about 2 weeks (around 11/06/2022) for blood pressure.     Nicoletta Ba, RN

## 2022-10-24 ENCOUNTER — Other Ambulatory Visit: Payer: Self-pay | Admitting: Family Medicine

## 2022-10-24 DIAGNOSIS — E559 Vitamin D deficiency, unspecified: Secondary | ICD-10-CM

## 2022-10-24 LAB — CBC WITH DIFFERENTIAL/PLATELET
Basophils Absolute: 0 10*3/uL (ref 0.0–0.2)
Basos: 1 %
EOS (ABSOLUTE): 0.1 10*3/uL (ref 0.0–0.4)
Eos: 3 %
Hematocrit: 39.5 % (ref 34.0–46.6)
Hemoglobin: 13 g/dL (ref 11.1–15.9)
Immature Grans (Abs): 0 10*3/uL (ref 0.0–0.1)
Immature Granulocytes: 0 %
Lymphocytes Absolute: 2.2 10*3/uL (ref 0.7–3.1)
Lymphs: 47 %
MCH: 31.2 pg (ref 26.6–33.0)
MCHC: 32.9 g/dL (ref 31.5–35.7)
MCV: 95 fL (ref 79–97)
Monocytes Absolute: 0.7 10*3/uL (ref 0.1–0.9)
Monocytes: 15 %
Neutrophils Absolute: 1.6 10*3/uL (ref 1.4–7.0)
Neutrophils: 34 %
Platelets: 302 10*3/uL (ref 150–450)
RBC: 4.17 x10E6/uL (ref 3.77–5.28)
RDW: 13.2 % (ref 11.7–15.4)
WBC: 4.7 10*3/uL (ref 3.4–10.8)

## 2022-10-24 LAB — TSH+FREE T4
Free T4: 1.1 ng/dL (ref 0.82–1.77)
TSH: 0.993 u[IU]/mL (ref 0.450–4.500)

## 2022-10-24 LAB — LIPID PANEL
Chol/HDL Ratio: 3.1 ratio (ref 0.0–4.4)
Cholesterol, Total: 217 mg/dL — ABNORMAL HIGH (ref 100–199)
HDL: 70 mg/dL (ref >39)
LDL Chol Calc (NIH): 129 mg/dL — ABNORMAL HIGH (ref 0–99)
Triglycerides: 100 mg/dL (ref 0–149)
VLDL Cholesterol Cal: 18 mg/dL (ref 5–40)

## 2022-10-24 LAB — CMP14+EGFR
ALT: 20 IU/L (ref 0–32)
AST: 17 IU/L (ref 0–40)
Albumin/Globulin Ratio: 1.7 (ref 1.2–2.2)
Albumin: 4.5 g/dL (ref 3.9–4.9)
Alkaline Phosphatase: 64 IU/L (ref 44–121)
BUN/Creatinine Ratio: 10 (ref 9–23)
BUN: 9 mg/dL (ref 6–24)
Bilirubin Total: <0.2 mg/dL (ref 0.0–1.2)
CO2: 22 mmol/L (ref 20–29)
Calcium: 9.9 mg/dL (ref 8.7–10.2)
Chloride: 101 mmol/L (ref 96–106)
Creatinine, Ser: 0.94 mg/dL (ref 0.57–1.00)
Globulin, Total: 2.7 g/dL (ref 1.5–4.5)
Glucose: 92 mg/dL (ref 70–99)
Potassium: 4.5 mmol/L (ref 3.5–5.2)
Sodium: 138 mmol/L (ref 134–144)
Total Protein: 7.2 g/dL (ref 6.0–8.5)
eGFR: 75 mL/min/{1.73_m2} (ref >59)

## 2022-10-24 LAB — HEMOGLOBIN A1C
Est. average glucose Bld gHb Est-mCnc: 126 mg/dL
Hgb A1c MFr Bld: 6 % — ABNORMAL HIGH (ref 4.8–5.6)

## 2022-10-24 LAB — VITAMIN D 25 HYDROXY (VIT D DEFICIENCY, FRACTURES): Vit D, 25-Hydroxy: 23.2 ng/mL — ABNORMAL LOW (ref 30.0–100.0)

## 2022-10-24 MED ORDER — VITAMIN D (ERGOCALCIFEROL) 1.25 MG (50000 UNIT) PO CAPS
50000.0000 [IU] | ORAL_CAPSULE | ORAL | 1 refills | Status: AC
Start: 2022-10-24 — End: ?
  Filled 2022-10-24: qty 12, 84d supply, fill #0
  Filled 2023-01-09: qty 12, 84d supply, fill #1
  Filled 2023-03-29: qty 12, 84d supply, fill #2
  Filled 2023-07-01: qty 4, 28d supply, fill #3

## 2022-10-25 ENCOUNTER — Other Ambulatory Visit (HOSPITAL_COMMUNITY): Payer: Self-pay

## 2022-11-06 ENCOUNTER — Encounter: Payer: Self-pay | Admitting: Family Medicine

## 2022-11-06 ENCOUNTER — Ambulatory Visit: Payer: Commercial Managed Care - PPO | Admitting: Family Medicine

## 2022-11-06 VITALS — BP 136/88 | HR 68 | Ht 64.0 in | Wt 159.1 lb

## 2022-11-06 DIAGNOSIS — R030 Elevated blood-pressure reading, without diagnosis of hypertension: Secondary | ICD-10-CM | POA: Diagnosis not present

## 2022-11-06 NOTE — Progress Notes (Signed)
Established Patient Office Visit  Subjective:  Patient ID: Jennifer Hansen, female    DOB: [birth year]  Age: 47 y.o. MRN: [id number]  CC:  Chief Complaint  Patient presents with   Hypertension    2 week follow up for blood pressure.    HPI Jennifer Hansen is a 47 y.o. female presents for blood pressure follow-up. For the details of today's visit, please refer to the assessment and plan.    Past Medical History:  Diagnosis Date   Acne     Past Surgical History:  Procedure Laterality Date   BUNIONECTOMY     COLONOSCOPY N/A 12/07/2020   Procedure: COLONOSCOPY;  Surgeon: Corbin Ade, MD;  Location: [facility];  Service: Endoscopy;  Laterality: N/A;  ASA I / 2:00   TUBAL LIGATION  2004    Family History  Problem Relation Age of Onset   Arthritis Mother    Diabetes Father    Cancer Maternal Grandmother        colon cancer   Diabetes Maternal Grandmother    Heart disease Maternal Grandmother    Cancer Maternal Grandfather    Aneurysm Paternal Grandfather    Breast cancer Neg Hx    Lung cancer Neg Hx    Cancer - Cervical Neg Hx     Social History   Socioeconomic History   Marital status: Widowed    Spouse name: Not on file   Number of children: 2   Years of education: Not on file   Highest education level: Not on file  Occupational History   Not on file  Tobacco Use   Smoking status: Never   Smokeless tobacco: Never  Vaping Use   Vaping Use: Never used  Substance and Sexual Activity   Alcohol use: No   Drug use: No   Sexual activity: Not Currently    Birth control/protection: Surgical    Comment: tubal  Other Topics Concern   Not on file  Social History Narrative   Lives with her daughter. Works in Research scientist (medical) at [employer].    Social Determinants of Health   Financial Resource Strain: Not on file  Food Insecurity: Not on file  Transportation Needs: Not on file  Physical Activity: Not on file  Stress: Not on file  Social Connections: Not on file   Intimate Partner Violence: Not on file    Outpatient Medications Prior to Visit  Medication Sig Dispense Refill   aluminum chloride (DRYSOL) 20 % external solution Apply 1 application topically at bedtime. 35 mL 0   Ascorbic Acid (VITAMIN C) 1000 MG tablet Take 1,000 mg by mouth daily.     benzonatate (TESSALON) 100 MG capsule Take 1 capsule (100 mg total) by mouth 3 (three) times daily as needed for cough. 30 capsule 0   Biotin 5 MG CAPS Take 5 mg by mouth daily.     cephALEXin (KEFLEX) 500 MG capsule Take 1 capsule (500 mg total) by mouth 2 (two) times daily. 14 capsule 0   Cholecalciferol (VITAMIN D) 125 MCG (5000 UT) CAPS Take 5,000 Units by mouth daily.     fluticasone (FLONASE) 50 MCG/ACT nasal spray Place 2 sprays into both nostrils daily. 16 g 0   Multiple Vitamin (MULTIVITAMIN WITH MINERALS) TABS tablet Take 1 tablet by mouth daily.     triamcinolone cream (KENALOG) 0.1 % Apply 1 application topically as needed (cerebric dermatitis). 30 g 1   Vitamin D, Ergocalciferol, (DRISDOL) 1.25 MG (50000 UNIT) CAPS capsule  Take 1 capsule (50,000 Units total) by mouth every 7 (seven) days. 20 capsule 1   No facility-administered medications prior to visit.    No Known Allergies  ROS Review of Systems  Constitutional:  Negative for chills and fever.  Eyes:  Negative for visual disturbance.  Respiratory:  Negative for chest tightness and shortness of breath.   Neurological:  Negative for dizziness and headaches.      Objective:    Physical Exam HENT:     Head: Normocephalic.     Mouth/Throat:     Mouth: Mucous membranes are moist.  Cardiovascular:     Rate and Rhythm: Normal rate.     Heart sounds: Normal heart sounds.  Pulmonary:     Effort: Pulmonary effort is normal.     Breath sounds: Normal breath sounds.  Neurological:     Mental Status: She is alert.     BP 136/88 (BP Location: Left Arm)   Pulse 68   Ht 5\' 4"  (1.626 m)   Wt 159 lb 1.9 oz (72.2 kg)   SpO2 97%    BMI 27.31 kg/m  Wt Readings from Last 3 Encounters:  11/06/22 159 lb 1.9 oz (72.2 kg)  10/23/22 164 lb 0.6 oz (74.4 kg)  10/18/21 160 lb (72.6 kg)    Lab Results  Component Value Date   TSH 0.993 10/23/2022   Lab Results  Component Value Date   WBC 4.7 10/23/2022   HGB 13.0 10/23/2022   HCT 39.5 10/23/2022   MCV 95 10/23/2022   PLT 302 10/23/2022   Lab Results  Component Value Date   NA 138 10/23/2022   K 4.5 10/23/2022   CO2 22 10/23/2022   GLUCOSE 92 10/23/2022   BUN 9 10/23/2022   CREATININE 0.94 10/23/2022   BILITOT <0.2 10/23/2022   ALKPHOS 64 10/23/2022   AST 17 10/23/2022   ALT 20 10/23/2022   PROT 7.2 10/23/2022   ALBUMIN 4.5 10/23/2022   CALCIUM 9.9 10/23/2022   EGFR 75 10/23/2022   Lab Results  Component Value Date   CHOL 217 (H) 10/23/2022   Lab Results  Component Value Date   HDL 70 10/23/2022   Lab Results  Component Value Date   LDLCALC 129 (H) 10/23/2022   Lab Results  Component Value Date   TRIG 100 10/23/2022   Lab Results  Component Value Date   CHOLHDL 3.1 10/23/2022   Lab Results  Component Value Date   HGBA1C 6.0 (H) 10/23/2022      Assessment & Plan:  Elevated BP without diagnosis of hypertension Assessment & Plan: Controlled Denies headaches, dizziness, blurred vision, chest pain, palpitation Encourage low-salt diet with increased physical activity BP Readings from Last 3 Encounters:  11/06/22 136/88  10/23/22 (!) 148/91  10/18/21 132/86        Follow-up: Return in about 4 months (around 03/09/2023).   Gilmore Laroche, FNP

## 2022-11-06 NOTE — Patient Instructions (Addendum)
I appreciate the opportunity to provide care to you today!    Follow up:  4 months    Please continue to a heart-healthy diet and increase your physical activities. Try to exercise for at least five days a week.      It was a pleasure to see you and I look forward to continuing to work together on your health and well-being. Please do not hesitate to call the office if you need care or have questions about your care.   Have a wonderful day and week. With Gratitude, Gilmore Laroche MSN, FNP-BC

## 2022-11-06 NOTE — Assessment & Plan Note (Signed)
Controlled Denies headaches, dizziness, blurred vision, chest pain, palpitation Encourage low-salt diet with increased physical activity BP Readings from Last 3 Encounters:  11/06/22 136/88  10/23/22 (!) 148/91  10/18/21 132/86

## 2022-12-10 ENCOUNTER — Other Ambulatory Visit (HOSPITAL_COMMUNITY): Payer: Self-pay

## 2022-12-10 DIAGNOSIS — L218 Other seborrheic dermatitis: Secondary | ICD-10-CM | POA: Diagnosis not present

## 2022-12-10 DIAGNOSIS — L700 Acne vulgaris: Secondary | ICD-10-CM | POA: Diagnosis not present

## 2022-12-10 MED ORDER — DOXYCYCLINE HYCLATE 100 MG PO TABS
100.0000 mg | ORAL_TABLET | Freq: Two times a day (BID) | ORAL | 3 refills | Status: AC
  Filled 2022-12-10: qty 60, 30d supply, fill #0
  Filled 2023-04-03: qty 60, 30d supply, fill #1

## 2022-12-10 MED ORDER — CLINDAMYCIN PHOS-BENZOYL PEROX 1.2-5 % EX GEL
Freq: Every morning | CUTANEOUS | 3 refills | Status: DC
  Filled 2022-12-10: qty 45, 30d supply, fill #0

## 2022-12-10 MED ORDER — FLUOCINOLONE ACETONIDE SCALP 0.01 % EX OIL
TOPICAL_OIL | Freq: Every day | CUTANEOUS | 3 refills | Status: AC
  Filled 2022-12-10: qty 118.28, 30d supply, fill #0
  Filled 2023-04-04: qty 118.28, 30d supply, fill #1
  Filled 2023-05-29: qty 118.28, 30d supply, fill #2

## 2023-04-05 ENCOUNTER — Encounter: Payer: Self-pay | Admitting: Family Medicine

## 2023-04-05 ENCOUNTER — Other Ambulatory Visit (HOSPITAL_COMMUNITY): Payer: Self-pay

## 2023-04-05 ENCOUNTER — Other Ambulatory Visit: Payer: Self-pay

## 2023-04-05 ENCOUNTER — Ambulatory Visit: Payer: Commercial Managed Care - PPO | Admitting: Family Medicine

## 2023-04-05 VITALS — BP 134/83 | HR 81 | Ht 64.0 in | Wt 162.0 lb

## 2023-04-05 DIAGNOSIS — R21 Rash and other nonspecific skin eruption: Secondary | ICD-10-CM

## 2023-04-05 MED ORDER — LEVOCETIRIZINE DIHYDROCHLORIDE 5 MG PO TABS
5.0000 mg | ORAL_TABLET | Freq: Every evening | ORAL | 1 refills | Status: AC
Start: 2023-04-05 — End: ?
  Filled 2023-04-05: qty 21, 21d supply, fill #0

## 2023-04-05 NOTE — Progress Notes (Unsigned)
Patient Office Visit   Subjective   Patient ID: Jennifer Hansen, female    DOB: 06/02/1976  Age: 47 y.o. MRN: [id number]  CC:  Chief Complaint  Patient presents with   Rash    Patient complains of bilateral ankle rash that keeps coming and going, this time started a week and a half ago.     HPI Jennifer Hansen 47 year old female, presents to the clinic for recurrent rash for the past 7 years with no improvemnt She  has a past medical history of Acne.  Rash This is a recurrent problem. Episode onset: chronic. The problem has been gradually worsening since onset. The affected locations include the right ankle and left ankle. The rash is characterized by itchiness and redness. It is unknown if there was an exposure to a precipitant. Pertinent negatives include no cough, fatigue, fever, joint pain or shortness of breath. Past treatments include topical steroids, moisturizer and antihistamine. The treatment provided moderate relief. There is no history of allergies, asthma or eczema.      Outpatient Encounter Medications as of 04/05/2023  Medication Sig   aluminum chloride (DRYSOL) 20 % external solution Apply 1 application topically at bedtime.   Ascorbic Acid (VITAMIN C) 1000 MG tablet Take 1,000 mg by mouth daily.   benzonatate (TESSALON) 100 MG capsule Take 1 capsule (100 mg total) by mouth 3 (three) times daily as needed for cough.   Biotin 5 MG CAPS Take 5 mg by mouth daily.   cephALEXin (KEFLEX) 500 MG capsule Take 1 capsule (500 mg total) by mouth 2 (two) times daily.   Cholecalciferol (VITAMIN D) 125 MCG (5000 UT) CAPS Take 5,000 Units by mouth daily.   Clindamycin-Benzoyl Per, Refr, gel Apply to affected areas topically in the morning.   doxycycline (VIBRA-TABS) 100 MG tablet Take 1 tablet (100 mg total) by mouth 2 (two) times daily with food and water. (sun warning)   Fluocinolone Acetonide Scalp (DERMA-SMOOTHE/FS SCALP) 0.01 % OIL Apply to affected areas at bedtime. Wash upon  waking if needed   fluticasone (FLONASE) 50 MCG/ACT nasal spray Place 2 sprays into both nostrils daily.   levocetirizine (XYZAL ALLERGY 24HR) 5 MG tablet Take 1 tablet (5 mg total) by mouth every evening.   Multiple Vitamin (MULTIVITAMIN WITH MINERALS) TABS tablet Take 1 tablet by mouth daily.   triamcinolone cream (KENALOG) 0.1 % Apply 1 application topically as needed (cerebric dermatitis).   Vitamin D, Ergocalciferol, (DRISDOL) 1.25 MG (50000 UNIT) CAPS capsule Take 1 capsule (50,000 Units total) by mouth every 7 (seven) days.   No facility-administered encounter medications on file as of 04/05/2023.    Past Surgical History:  Procedure Laterality Date   BUNIONECTOMY     COLONOSCOPY N/A 12/07/2020   Procedure: COLONOSCOPY;  Surgeon: Corbin Ade, MD;  Location: [facility];  Service: Endoscopy;  Laterality: N/A;  ASA I / 2:00   TUBAL LIGATION  2004    Review of Systems  Constitutional:  Negative for fatigue and fever.  Respiratory:  Negative for cough and shortness of breath.   Cardiovascular:  Negative for chest pain.  Musculoskeletal:  Negative for joint pain.  Skin:  Positive for rash. Negative for itching.  Neurological:  Negative for dizziness.      Objective    BP 134/83   Pulse 81   Ht 5\' 4"  (1.626 m)   Wt 162 lb (73.5 kg)   SpO2 97%   BMI 27.81 kg/m   Physical  Exam Vitals reviewed.  Constitutional:      General: She is not in acute distress.    Appearance: Normal appearance. She is not ill-appearing, toxic-appearing or diaphoretic.  HENT:     Head: Normocephalic.  Eyes:     General:        Right eye: No discharge.        Left eye: No discharge.     Conjunctiva/sclera: Conjunctivae normal.  Cardiovascular:     Rate and Rhythm: Normal rate.     Pulses: Normal pulses.     Heart sounds: Normal heart sounds.  Pulmonary:     Effort: Pulmonary effort is normal. No respiratory distress.     Breath sounds: Normal breath sounds.  Abdominal:     General:  Bowel sounds are normal.     Palpations: Abdomen is soft.  Musculoskeletal:        General: Normal range of motion.     Cervical back: Normal range of motion.  Skin:    General: Skin is warm and dry.     Capillary Refill: Capillary refill takes less than 2 seconds.     Comments: Left leg :Skin appears inflamed and irritated, with dry patches surrounding lower leg. No pustules or purulent drainage noted.  Neurological:     General: No focal deficit present.     Mental Status: She is alert and oriented to person, place, and time.     Coordination: Coordination normal.     Gait: Gait normal.  Psychiatric:        Mood and Affect: Mood normal.        Behavior: Behavior normal.        Thought Content: Thought content normal.        Judgment: Judgment normal.       Assessment & Plan:  Rash of unknown etiology Assessment & Plan: Patient was seen my dermatology and was prescribed Kenalog ointment Started Xyzal 5 mg PRN for itch relief Advise patient to apply cold , wet cloth or ice pack to the skin that itches, wear loose-fitting , cotton clothing, use fragrance-free lotions, soaps, and detergents to minimize irritation.  Referral placed to allergy testing    Orders: -     Levocetirizine Dihydrochloride; Take 1 tablet (5 mg total) by mouth every evening.  Dispense: 21 tablet; Refill: 1 -     Ambulatory referral to Allergy    Return if symptoms worsen or fail to improve.   Cruzita Lederer Newman Nip, FNP

## 2023-04-05 NOTE — Progress Notes (Unsigned)
   New Patient Office Visit   Subjective   Patient ID: Thu Baggett, female    DOB: 07-07-1975  Age: 47 y.o. MRN: [id number]  CC:  Chief Complaint  Patient presents with   Rash    Patient complains of bilateral ankle rash that keeps coming and going, this time started a week and a half ago.     HPI Fredrik Cove Lackman presents to establish care. She  has a past medical history of Acne.  HPI  ***  Outpatient Encounter Medications as of 04/05/2023  Medication Sig   aluminum chloride (DRYSOL) 20 % external solution Apply 1 application topically at bedtime.   Ascorbic Acid (VITAMIN C) 1000 MG tablet Take 1,000 mg by mouth daily.   benzonatate (TESSALON) 100 MG capsule Take 1 capsule (100 mg total) by mouth 3 (three) times daily as needed for cough.   Biotin 5 MG CAPS Take 5 mg by mouth daily.   cephALEXin (KEFLEX) 500 MG capsule Take 1 capsule (500 mg total) by mouth 2 (two) times daily.   Cholecalciferol (VITAMIN D) 125 MCG (5000 UT) CAPS Take 5,000 Units by mouth daily.   Clindamycin-Benzoyl Per, Refr, gel Apply to affected areas topically in the morning.   doxycycline (VIBRA-TABS) 100 MG tablet Take 1 tablet (100 mg total) by mouth 2 (two) times daily with food and water. (sun warning)   Fluocinolone Acetonide Scalp (DERMA-SMOOTHE/FS SCALP) 0.01 % OIL Apply to affected areas at bedtime. Wash upon waking if needed   fluticasone (FLONASE) 50 MCG/ACT nasal spray Place 2 sprays into both nostrils daily.   Multiple Vitamin (MULTIVITAMIN WITH MINERALS) TABS tablet Take 1 tablet by mouth daily.   triamcinolone cream (KENALOG) 0.1 % Apply 1 application topically as needed (cerebric dermatitis).   Vitamin D, Ergocalciferol, (DRISDOL) 1.25 MG (50000 UNIT) CAPS capsule Take 1 capsule (50,000 Units total) by mouth every 7 (seven) days.   No facility-administered encounter medications on file as of 04/05/2023.    Past Surgical History:  Procedure Laterality Date   BUNIONECTOMY      COLONOSCOPY N/A 12/07/2020   Procedure: COLONOSCOPY;  Surgeon: Corbin Ade, MD;  [facility]: [facility];  Service: Endoscopy;  Laterality: N/A;  ASA I / 2:00   TUBAL LIGATION  2004    ROS    Objective    BP 134/83   Pulse 81   Ht 5\' 4"  (1.626 m)   Wt 162 lb (73.5 kg)   SpO2 97%   BMI 27.81 kg/m   Physical Exam    Assessment & Plan:  There are no diagnoses linked to this encounter.  No follow-ups on file.   Cruzita Lederer Newman Nip, FNP

## 2023-04-05 NOTE — Patient Instructions (Signed)
        Great to see you today.  I have refilled the medication(s) we provide.    - Please take medications as prescribed. - Follow up with your primary health provider if any health concerns arises. - If symptoms worsen please contact your primary care provider and/or visit the emergency department.  

## 2023-04-06 DIAGNOSIS — R21 Rash and other nonspecific skin eruption: Secondary | ICD-10-CM | POA: Insufficient documentation

## 2023-04-06 NOTE — Assessment & Plan Note (Signed)
Patient was seen my dermatology and was prescribed Kenalog ointment Started Xyzal 5 mg PRN for itch relief Advise patient to apply cold , wet cloth or ice pack to the skin that itches, wear loose-fitting , cotton clothing, use fragrance-free lotions, soaps, and detergents to minimize irritation.  Referral placed to allergy testing

## 2023-05-01 ENCOUNTER — Ambulatory Visit: Payer: Commercial Managed Care - PPO | Admitting: Allergy & Immunology

## 2023-05-01 ENCOUNTER — Encounter: Payer: Self-pay | Admitting: Allergy & Immunology

## 2023-05-01 ENCOUNTER — Other Ambulatory Visit: Payer: Self-pay

## 2023-05-01 VITALS — BP 120/80 | HR 74 | Temp 98.6°F | Resp 16 | Ht 64.17 in | Wt 153.5 lb

## 2023-05-01 DIAGNOSIS — L239 Allergic contact dermatitis, unspecified cause: Secondary | ICD-10-CM

## 2023-05-01 MED ORDER — OPZELURA 1.5 % EX CREA: 1.0000 | TOPICAL_CREAM | Freq: Two times a day (BID) | CUTANEOUS | 5 refills | Status: DC | PRN

## 2023-05-01 NOTE — Progress Notes (Signed)
NEW PATIENT  Date of Service/Encounter:  05/01/23  Consult requested by: Rica Records, FNP   Assessment:   Allergic contact dermatitis of bilateral ankles  Plan/Recommendations:   1. Allergic contact dermatitis - We are going to do patch testing at the next visit. - This looks for sensitizations to things such as chemicals, dyes, steroids, and other items. - We can also patch testing to things like cosmetics that you apply to the affected area (although I am not sure you are using cosmetics on your ankles!). - These are placed on a Monday and read on a Wednesday and Friday. - Sample of Opzelura provided to use twice daily.   2. Return in about 5 days (around 05/06/2023). You can have the follow up appointment with Dr. Dellis Anes or a Nurse Practicioner (our Nurse Practitioners are excellent and always have Physician oversight!).    This note in its entirety was forwarded to the Provider who requested this consultation.  Subjective:   Jennifer Hansen is a 47 y.o. female presenting today for evaluation of  Chief Complaint  Patient presents with   Rash    Appears randomly on ankles and legs no known cause   Pruritus    Jennifer Hansen has a history of the following: Patient Active Problem List   Diagnosis Date Noted   Rash of unknown etiology 04/06/2023   Elevated BP without diagnosis of hypertension 10/23/2022   Encounter for annual general medical examination with abnormal findings in adult 10/18/2021   Skin mole 10/18/2021   Overweight (BMI 25.0-29.9) 08/03/2021   Prediabetes 10/12/2020   Dermoid cyst of leg, left 10/11/2020   Bunion 11/18/2015    History obtained from: chart review and patient.  Discussed the use of AI scribe software for clinical note transcription with the patient and/or guardian, who gave verbal consent to proceed.  Jennifer Hansen was referred by Rica Records, FNP.     Jennifer Hansen is a 47 y.o. female presenting for an  evaluation of a rash .  The patient presents with a recurring rash on the ankles that has been ongoing for several years. The rash is described as itchy and flat, with no associated swelling or bumps. The patient reports that the rash tends to spread if scratched. The rash is not associated with any other systemic symptoms such as sneezing, itchy eyes, runny nose, or food reactions. The patient denies any history of asthma, venous infections, ear infections, or pneumonia.  The patient has previously seen a dermatologist, Dr. Margo Aye, who suggested the rash could be due to poor circulation or venous stasis dermatitis. The patient has tried various creams, including steroid creams, which have provided some relief. The patient also reports using a scented liquid laundry detergent, which has been used for quite some time. She has never needed a biopsy.   The rash is localized to the lower extremities and does not appear elsewhere on the body. The patient typically does not wear socks unless wearing sneakers. The patient works from home and does not have to wear shoes regularly.  The patient has no known allergies and does not smoke. The patient lives alone and no one else in the household has similar symptoms. The patient has a daughter who does not live with them. 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    Otherwise, there is no history of other atopic diseases, including asthma, food allergies, drug allergies, environmental allergies, stinging insect allergies, or contact dermatitis. There is  no significant infectious history. Vaccinations are up to date.    Past Medical History: Patient Active Problem List   Diagnosis Date Noted   Rash of unknown etiology 04/06/2023   Elevated BP without diagnosis of hypertension 10/23/2022   Encounter for annual general medical examination with abnormal findings in adult 10/18/2021   Skin mole 10/18/2021   Overweight (BMI 25.0-29.9) 08/03/2021   Prediabetes 10/12/2020    Dermoid cyst of leg, left 10/11/2020   Bunion 11/18/2015    Medication List:  Allergies as of 05/01/2023   No Known Allergies      Medication List        Accurate as of May 01, 2023 10:45 AM. If you have any questions, ask your nurse or doctor.          benzonatate 100 MG capsule Commonly known as: TESSALON Take 1 capsule (100 mg total) by mouth 3 (three) times daily as needed for cough.   Biotin 5 MG Caps Take 5 mg by mouth daily.   cephALEXin 500 MG capsule Commonly known as: KEFLEX Take 1 capsule (500 mg total) by mouth 2 (two) times daily.   Clindamycin-Benzoyl Per (Refr) gel Apply to affected areas topically in the morning.   doxycycline 100 MG tablet Commonly known as: VIBRA-TABS Take 1 tablet (100 mg total) by mouth 2 (two) times daily with food and water. (sun warning)   Drysol 20 % external solution Generic drug: aluminum chloride Apply 1 application topically at bedtime.   Fluocinolone Acetonide Scalp 0.01 % Oil Commonly known as: Derma-Smoothe/FS Scalp Apply to affected areas at bedtime. Wash upon waking if needed   fluticasone 50 MCG/ACT nasal spray Commonly known as: FLONASE Place 2 sprays into both nostrils daily.   levocetirizine 5 MG tablet Commonly known as: Xyzal Allergy 24HR Take 1 tablet (5 mg total) by mouth every evening.   multivitamin with minerals Tabs tablet Take 1 tablet by mouth daily.   triamcinolone cream 0.1 % Commonly known as: KENALOG Apply 1 application topically as needed (cerebric dermatitis).   vitamin C 1000 MG tablet Take 1,000 mg by mouth daily.   Vitamin D (Ergocalciferol) 1.25 MG (50000 UNIT) Caps capsule Commonly known as: DRISDOL Take 1 capsule (50,000 Units total) by mouth every 7 (seven) days.   Vitamin D 125 MCG (5000 UT) Caps Take 5,000 Units by mouth daily.        Birth History: non-contributory  Developmental History: non-contributory  Past Surgical History: Past Surgical History:   Procedure Laterality Date   BUNIONECTOMY     COLONOSCOPY N/A 12/07/2020   Procedure: COLONOSCOPY;  Surgeon: Corbin Ade, MD;  Location: [facility];  Service: Endoscopy;  Laterality: N/A;  ASA I / 2:00   TUBAL LIGATION  2004     Family History: Family History  Problem Relation Age of Onset   Arthritis Mother    Diabetes Father    Cancer Maternal Grandmother        colon cancer   Diabetes Maternal Grandmother    Heart disease Maternal Grandmother    Cancer Maternal Grandfather    Aneurysm Paternal Grandfather    Breast cancer Neg Hx    Lung cancer Neg Hx    Cancer - Cervical Neg Hx      Social History: Jennifer lives at home with her family. They live in a house that is 47 years old.  There is carpeting throughout the home.  She has gas and electric heating with central cooling.  There  are no animals inside or outside of the home.  There are dust mite covers on the bed, but not the pillows.  There is no tobacco exposure.  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  There is no fume, chemical, or dust exposure.  She does have a HEPA filter.  She [place] not [place] interstate or industrial area.   Review of systems otherwise negative other than that mentioned in the HPI.    Objective:   Blood pressure 120/80, pulse 74, temperature 98.6 F (37 C), resp. rate 16, height 5' 4.17" (1.63 m), weight 153 lb 8 oz (69.6 kg), SpO2 97%. Body mass index is 26.21 kg/m.     Physical Exam Vitals reviewed.  Constitutional:      Appearance: She is well-developed.  HENT:     Head: Normocephalic and atraumatic.     Right Ear: Tympanic membrane, ear canal and external ear normal. No drainage, swelling or tenderness. Tympanic membrane is not injected, scarred, erythematous, retracted or bulging.     Left Ear: Tympanic membrane, ear canal and external ear normal. No drainage, swelling or tenderness. Tympanic membrane is not injected, scarred, erythematous,  retracted or bulging.     Nose: No nasal deformity, septal deviation, mucosal edema or rhinorrhea.     Right Sinus: No maxillary sinus tenderness or frontal sinus tenderness.     Left Sinus: No maxillary sinus tenderness or frontal sinus tenderness.     Mouth/Throat:     Mouth: Mucous membranes are not pale and not dry.     Pharynx: Uvula midline.  Eyes:     General:        Right eye: No discharge.        Left eye: No discharge.     Conjunctiva/sclera: Conjunctivae normal.     Right eye: Right conjunctiva is not injected. No chemosis.    Left eye: Left conjunctiva is not injected. No chemosis.    Pupils: Pupils are equal, round, and reactive to light.  Cardiovascular:     Rate and Rhythm: Normal rate and regular rhythm.     Heart sounds: Normal heart sounds.  Pulmonary:     Effort: Pulmonary effort is normal. No tachypnea, accessory muscle usage or respiratory distress.     Breath sounds: Normal breath sounds. No wheezing, rhonchi or rales.  Chest:     Chest wall: No tenderness.  Abdominal:     Tenderness: There is no abdominal tenderness. There is no guarding or rebound.  Lymphadenopathy:     Head:     Right side of head: No submandibular, tonsillar or occipital adenopathy.     Left side of head: No submandibular, tonsillar or occipital adenopathy.     Cervical: No cervical adenopathy.  Skin:    General: Skin is warm.     Capillary Refill: Capillary refill takes less than 2 seconds.     Coloration: Skin is not pale.     Findings: Rash present. No abrasion, erythema or petechiae. Rash is not papular, urticarial or vesicular.     Comments: She has a flat rash on her bilateral ankles without honey crusting or oozing noted.   Neurological:     Mental Status: She is alert.          Diagnostic studies: none            Malachi Bonds, MD [facility]

## 2023-05-01 NOTE — Patient Instructions (Addendum)
1. Allergic contact dermatitis - We are going to do patch testing at the next visit. - This looks for sensitizations to things such as chemicals, dyes, steroids, and other items. - We can also patch testing to things like cosmetics that you apply to the affected area (although I am not sure you are using cosmetics on your ankles!). - These are placed on a Monday and read on a Wednesday and Friday. - Sample of Opzelura provided to use twice daily.   2. Return in about 5 days (around 05/06/2023). You can have the follow up appointment with Dr. Dellis Anes or a Nurse Practicioner (our Nurse Practitioners are excellent and always have Physician oversight!).    Please inform us of any Emergency Department visits, hospitalizations, or changes in symptoms. Call us before going to the ED for breathing or allergy symptoms since we might be able to fit you in for a sick visit. Feel free to contact us anytime with any questions, problems, or concerns.  It was a pleasure to meet you today!  Websites that have reliable patient information: 1. American Academy of Asthma, Allergy, and Immunology: www.aaaai.org 2. Food Allergy Research and Education (FARE): foodallergy.org 3. Mothers of Asthmatics: http://www.asthmacommunitynetwork.org 4. American College of Allergy, Asthma, and Immunology: www.acaai.org   COVID-19 Vaccine Information can be found at: [email or web address] For questions related to vaccine distribution or appointments, please email [email or web address] or call [phone].     "Like" [place] on Facebook and Instagram for our latest updates!      A healthy democracy works best when [employer] participate! Make sure you are registered to vote! If you have moved or changed any of your contact information, you will need to get this updated before voting! Scan the QR codes below to learn more!       True Test looks for the following  sensitivities:

## 2023-05-02 ENCOUNTER — Telehealth: Payer: Self-pay

## 2023-05-02 ENCOUNTER — Other Ambulatory Visit (HOSPITAL_COMMUNITY): Payer: Self-pay

## 2023-05-02 NOTE — Telephone Encounter (Signed)
Pharmacy Patient Advocate Encounter   Received notification from CoverMyMeds that prior authorization for Opzelura 1.5% cream is required/requested.   Insurance verification completed.   The patient is insured through [facility] .   Per test claim: PA required; PA submitted to above mentioned insurance via CoverMyMeds Key/confirmation #/EOC [id number] Status is pending

## 2023-05-06 ENCOUNTER — Ambulatory Visit: Payer: Commercial Managed Care - PPO | Admitting: Internal Medicine

## 2023-05-06 VITALS — Temp 98.7°F

## 2023-05-06 DIAGNOSIS — L2389 Allergic contact dermatitis due to other agents: Secondary | ICD-10-CM

## 2023-05-06 NOTE — Patient Instructions (Addendum)
Concern for contact dermatitis Discussed with patient that patch testing tests for contact dermatitis. Positive patch testing results can help in avoiding those items.  There is a chance of false negative results or the chemical causing your reaction is not part of the patch panel. Our panel is the FDA approved T.R.U.E. TEST.   Please avoid strenuous physical activities and do not get the patches on the back wet.  Okay to take antihistamines for itching but avoid placing any creams on the back where the patches are. No steroids for 2 weeks before.  We will remove the patches on Wednesday and will do our initial read. Then you will come back on Friday for a final read Pictures of patches placed added to Epic.

## 2023-05-06 NOTE — Telephone Encounter (Signed)
Pharmacy Patient Advocate Encounter  Received notification from [facility] that Prior Authorization for Opzelura 1.5% cream has been DENIED.  Full denial letter will be uploaded to the media tab. See denial reason below.  For approval of this request, our guideline named RUXOLITINIB TOPICAL (reviewed for Opzelura cream) requires that you have a diagnosis of atopic dermatitis (a skin condition that causes itchy, red, irritated skin) or non-segmental vitiligo (a condition that causes your skin to lose its color, or pigment, in patches).  When used for the treatment of an off-label use, our guideline named OFF-LABEL requires that the following conditions be met:   1)Your provider has provided documentation that supports that the requested off-label use is considered safe and effective by approved compendia, guidelines used by leading nationally-recognized associates and agencies.  2)Your provider must submit at least two (2) high-quality articles from major peer-reviewed medical journals supporting the use of Opzelura for treatment of your condition.  This request has been denied because we did not receive information showing that you have atopic dermatitis or non-segmental vitilligo or that you meet the OFF-LABEL guideline requirements.  PA #/Case ID/Reference #: [id number]

## 2023-05-06 NOTE — Addendum Note (Signed)
Addended by: Elsworth Soho on: 05/06/2023 10:18 AM   Modules accepted: Orders

## 2023-05-06 NOTE — Progress Notes (Signed)
    Follow-up Note  RE: Jennifer Hansen MRN: [id number] DOB: 1975/09/29 Date of Office Visit: 05/06/2023  Primary care provider: Rica Records, FNP Referring provider: Jennette Banker, Tillie Fantasia returns to the office today for the patch test placement, given suspected history of contact dermatitis.    Diagnostics: True Test patches placed.    Plan:   Allergic contact dermatitis - Instructions provided on care of the patches for the next 48 hours. - Jennifer Hansen was instructed to avoid showering for the next 48 hours. - Jennifer Hansen will follow up in 48 hours and 96 hours for patch readings.   Concern for contact dermatitis Discussed with patient that patch testing tests for contact dermatitis. Positive patch testing results can help in avoiding those items.  There is a chance of false negative results or the chemical causing your reaction is not part of the patch panel. Our panel is the FDA approved T.R.U.E. TEST.   Please avoid strenuous physical activities and do not get the patches on the back wet.  Okay to take antihistamines for itching but avoid placing any creams on the back where the patches are. No steroids for 2 weeks before.  We will remove the patches on Wednesday and will do our initial read. Then you will come back on Friday for a final read Pictures of patches placed added to Epic.   Alesia Morin MD [facility]

## 2023-05-08 ENCOUNTER — Other Ambulatory Visit (HOSPITAL_COMMUNITY): Payer: Self-pay

## 2023-05-08 ENCOUNTER — Ambulatory Visit (INDEPENDENT_AMBULATORY_CARE_PROVIDER_SITE_OTHER): Payer: Commercial Managed Care - PPO | Admitting: Allergy & Immunology

## 2023-05-08 ENCOUNTER — Encounter: Payer: Self-pay | Admitting: Allergy & Immunology

## 2023-05-08 DIAGNOSIS — L2389 Allergic contact dermatitis due to other agents: Secondary | ICD-10-CM

## 2023-05-08 MED ORDER — PIMECROLIMUS 1 % EX CREA
TOPICAL_CREAM | Freq: Two times a day (BID) | CUTANEOUS | 5 refills | Status: AC
  Filled 2023-05-08: qty 30, 30d supply, fill #0

## 2023-05-08 NOTE — Telephone Encounter (Signed)
I called patient and informed that elidel has been sent into [place]. Patient said Dr.Gallagher had informed her earlier when she came in for an ov.

## 2023-05-08 NOTE — Progress Notes (Signed)
    Follow-up Note  RE: Jennifer Hansen MRN: [id number] DOB: December 29, 1975 Date of Office Visit: 05/08/2023  Primary care provider: Rica Records, FNP Referring provider: Jennette Banker, Tillie Fantasia returns to the office today for the initial patch test interpretation, given suspected history of contact dermatitis.    Diagnostics:   TRUE TEST 48-hour reading: NEGATIVE TO THE ENTIRE PANEL   Plan:   Allergic contact dermatitis - The patient has been provided detailed information regarding the substances she is sensitive to, as well as products containing the substances.   - We will see her again in two days for another reading.    Malachi Bonds, MD  [facility]

## 2023-05-08 NOTE — Telephone Encounter (Signed)
Ok I sent in [place] instead. I do not think that she has confirmed eczema.   Malachi Bonds, MD [facility]

## 2023-05-10 ENCOUNTER — Ambulatory Visit (INDEPENDENT_AMBULATORY_CARE_PROVIDER_SITE_OTHER): Payer: Commercial Managed Care - PPO | Admitting: Allergy & Immunology

## 2023-05-10 ENCOUNTER — Encounter: Payer: Self-pay | Admitting: Allergy & Immunology

## 2023-05-10 DIAGNOSIS — L2389 Allergic contact dermatitis due to other agents: Secondary | ICD-10-CM | POA: Diagnosis not present

## 2023-05-10 NOTE — Patient Instructions (Signed)
-   Testing was reactive to THIMEROSAL. - Sheet provided. -II am not convinced that this is related to your rash. - Come back on Monday for one final reading.

## 2023-05-10 NOTE — Progress Notes (Signed)
    Follow-up Note  RE: Jennifer Hansen MRN: [id number] DOB: 11-25-1975 Date of Office Visit: 05/10/2023  Primary care provider: Rica Records, FNP Referring provider: Jennette Banker, Tillie Fantasia returns to the office today for the final patch test interpretation, given suspected history of contact dermatitis.    Diagnostics:   TRUE TEST 96-hour reading:  2+ reaction to #23 (Thiomersal)   Plan:   Allergic contact dermatitis - The patient has been provided detailed information regarding the substances she is sensitive to, as well as products containing the substances.   - Meticulous avoidance of these substances is recommended.  - If avoidance is not possible, the use of barrier creams or lotions is recommended. - If symptoms persist or progress despite meticulous avoidance of thimerosal, Dermatology Referral may be warranted.   Malachi Bonds, MD  [facility]

## 2023-05-13 ENCOUNTER — Ambulatory Visit (INDEPENDENT_AMBULATORY_CARE_PROVIDER_SITE_OTHER): Payer: Commercial Managed Care - PPO | Admitting: Internal Medicine

## 2023-05-13 DIAGNOSIS — L2389 Allergic contact dermatitis due to other agents: Secondary | ICD-10-CM

## 2023-05-13 NOTE — Progress Notes (Signed)
   Follow Up Note  RE: Jennifer Hansen MRN: [id number] DOB: [birth year] Date of Office Visit: 05/13/2023  Referring provider: Wylene Men* Primary care provider: Rica Records, FNP  History of Present Illness: I had the pleasure of seeing Jennifer Hansen for a follow up visit at the [facility] on 05/13/2023. She is a 47 y.o. female, who is being followed for final patch testing. read. Today she is here for final patch test interpretation, given suspected history of contact dermatitis.   Diagnostics:  TRUE TEST delayed 7 day reading:  Positive to thiomersal  Jennifer returns to the office today for the final patch test interpretation, given suspected history of contact dermatitis.    Assessment and Plan: Timolin is a 47 y.o. female with: Allergic contact dermatitis - Positive patch testing 05/2023: thiomersal - Do a daily soaking tub bath in warm water for 10-15 minutes.  - Use a gentle, unscented cleanser at the end of the bath (such as Dove unscented bar or baby wash, or Aveeno sensitive body wash). Then rinse, pat half-way dry, and apply a gentle, unscented moisturizer cream or ointment (Cerave, Cetaphil, Eucerin, Aveeno)  all over while still damp. Dry skin makes the itching and rash worse. The skin should be moisturized with a gentle, unscented moisturizer at least twice daily.  - Use only unscented liquid laundry detergent. - Sample of Opzelura provided to use twice daily previously. - Put  Elidel onto areas of rough rash twice a day. May decrease to once a day as the eczema improves. This will not thin the skin, and is safe for chronic use. Do not put this onto normal appearing skin.  Return in about 3 months (around 08/13/2023).  It was my pleasure to see Jennifer today and participate in her care. Please feel free to contact me with any questions or concerns.  Sincerely,   Alesia Morin, MD [facility]

## 2023-05-13 NOTE — Patient Instructions (Addendum)
Allergic contact dermatitis - Positive patch testing 05/2023: thiomersal - Do a daily soaking tub bath in warm water for 10-15 minutes.  - Use a gentle, unscented cleanser at the end of the bath (such as Dove unscented bar or baby wash, or Aveeno sensitive body wash). Then rinse, pat half-way dry, and apply a gentle, unscented moisturizer cream or ointment (Cerave, Cetaphil, Eucerin, Aveeno)  all over while still damp. Dry skin makes the itching and rash worse. The skin should be moisturized with a gentle, unscented moisturizer at least twice daily.  - Use only unscented liquid laundry detergent. - Sample of Opzelura provided to use twice daily previously. - Put  Elidel onto areas of rough rash twice a day. May decrease to once a day as the eczema improves. This will not thin the skin, and is safe for chronic use. Do not put this onto normal appearing skin.   2. Return in about 3 months (around 08/13/2023).

## 2023-07-02 ENCOUNTER — Other Ambulatory Visit: Payer: Self-pay

## 2023-07-02 ENCOUNTER — Other Ambulatory Visit (HOSPITAL_COMMUNITY): Payer: Self-pay

## 2023-08-02 ENCOUNTER — Other Ambulatory Visit: Payer: Self-pay | Admitting: Family Medicine

## 2023-08-02 DIAGNOSIS — Z1231 Encounter for screening mammogram for malignant neoplasm of breast: Secondary | ICD-10-CM

## 2023-08-19 ENCOUNTER — Ambulatory Visit: Payer: Commercial Managed Care - PPO | Admitting: Internal Medicine

## 2023-09-09 ENCOUNTER — Ambulatory Visit
Admission: RE | Admit: 2023-09-09 | Discharge: 2023-09-09 | Disposition: A | Discharge status: Home / Self Care | Payer: Commercial Managed Care - PPO | Source: Ambulatory Visit | Attending: Family Medicine | Admitting: Family Medicine

## 2023-09-09 DIAGNOSIS — Z1231 Encounter for screening mammogram for malignant neoplasm of breast: Secondary | ICD-10-CM | POA: Diagnosis not present

## 2023-09-19 DIAGNOSIS — H52223 Regular astigmatism, bilateral: Secondary | ICD-10-CM | POA: Diagnosis not present

## 2023-09-19 DIAGNOSIS — H5213 Myopia, bilateral: Secondary | ICD-10-CM | POA: Diagnosis not present

## 2023-09-19 DIAGNOSIS — H524 Presbyopia: Secondary | ICD-10-CM | POA: Diagnosis not present

## 2023-10-25 ENCOUNTER — Encounter: Payer: Self-pay | Admitting: Family Medicine

## 2023-10-25 ENCOUNTER — Ambulatory Visit (INDEPENDENT_AMBULATORY_CARE_PROVIDER_SITE_OTHER): Payer: Commercial Managed Care - PPO | Admitting: Family Medicine

## 2023-10-25 VITALS — BP 134/87 | HR 79 | Resp 18 | Ht 64.0 in | Wt 165.0 lb

## 2023-10-25 DIAGNOSIS — E559 Vitamin D deficiency, unspecified: Secondary | ICD-10-CM | POA: Diagnosis not present

## 2023-10-25 DIAGNOSIS — R7303 Prediabetes: Secondary | ICD-10-CM | POA: Diagnosis not present

## 2023-10-25 DIAGNOSIS — E038 Other specified hypothyroidism: Secondary | ICD-10-CM | POA: Diagnosis not present

## 2023-10-25 DIAGNOSIS — E663 Overweight: Secondary | ICD-10-CM

## 2023-10-25 DIAGNOSIS — R7301 Impaired fasting glucose: Secondary | ICD-10-CM

## 2023-10-25 DIAGNOSIS — E7849 Other hyperlipidemia: Secondary | ICD-10-CM

## 2023-10-25 NOTE — Patient Instructions (Signed)
 I appreciate the opportunity to provide care to you today!    Follow up:  1 or 2 weeks follow up for physical exam  Labs: please stop by the lab today to get your blood drawn (CBC, CMP, TSH, Lipid profile, HgA1c, Vit D)    Please continue to a heart-healthy diet and increase your physical activities. Try to exercise for at least five days a week.    It was a pleasure to see you and I look forward to continuing to work together on your health and well-being. Please do not hesitate to call the office if you need care or have questions about your care.  In case of emergency, please visit the Emergency Department for urgent care, or contact our clinic at [phone] to schedule an appointment. We're here to help you!   Have a wonderful day and week. With Gratitude, Ryliee Figge MSN, FNP-BC

## 2023-10-25 NOTE — Assessment & Plan Note (Signed)
 Lifestyle modifications for prediabetes were discussed, including adopting a heart-healthy diet and increasing physical activity. The patient was encouraged to decrease her intake of high-sugar foods and beverages. She verbalized understanding and is aware of the plan of care.

## 2023-10-25 NOTE — Progress Notes (Signed)
 Established Patient Office Visit  Subjective:  Patient ID: Jennifer Hansen, female    DOB: 06/08/1976  Age: 48 y.o. MRN: [id number]  CC:  Chief Complaint  Patient presents with   Annual Exam    CPE     HPI Jennifer Hansen is a 48 y.o. female with past medical history of prediabetes, elevated BP without diagnosis of hypertension presents for f/u of  chronic medical conditions. For the details of today's visit, please refer to the assessment and plan.     Past Medical History:  Diagnosis Date   Acne     Past Surgical History:  Procedure Laterality Date   BUNIONECTOMY     COLONOSCOPY N/A 12/07/2020   Procedure: COLONOSCOPY;  Surgeon: Suzette Espy, MD;  Location: [facility];  Service: Endoscopy;  Laterality: N/A;  ASA I / 2:00   TUBAL LIGATION  2004    Family History  Problem Relation Age of Onset   Arthritis Mother    Diabetes Father    Cancer Maternal Grandmother        colon cancer   Diabetes Maternal Grandmother    Heart disease Maternal Grandmother    Cancer Maternal Grandfather    Aneurysm Paternal Grandfather    Breast cancer Neg Hx    Lung cancer Neg Hx    Cancer - Cervical Neg Hx     Social History   Socioeconomic History   Marital status: Widowed    Spouse name: Not on file   Number of children: 2   Years of education: Not on file   Highest education level: Bachelor's degree (e.g., BA, AB, BS)  Occupational History   Not on file  Tobacco Use   Smoking status: Never    Passive exposure: Never   Smokeless tobacco: Never  Vaping Use   Vaping status: Never Used  Substance and Sexual Activity   Alcohol use: No   Drug use: No   Sexual activity: Not Currently    Birth control/protection: Surgical    Comment: tubal  Other Topics Concern   Not on file  Social History Narrative   Lives with her daughter. Works in Research scientist (medical) at [employer].    Social Drivers of Corporate investment banker Strain: Low Risk  (10/24/2023)   Overall Financial Resource  Strain (CARDIA)    Difficulty of Paying Living Expenses: Not hard at all  Food Insecurity: No Food Insecurity (10/24/2023)   Hunger Vital Sign    Worried About Running Out of Food in the Last Year: Never true    Ran Out of Food in the Last Year: Never true  Transportation Needs: No Transportation Needs (10/24/2023)   PRAPARE - Administrator, Civil Service (Medical): No    Lack of Transportation (Non-Medical): No  Physical Activity: Insufficiently Active (10/24/2023)   Exercise Vital Sign    Days of Exercise per Week: 3 days    Minutes of Exercise per Session: 30 min  Stress: No Stress Concern Present (10/24/2023)   [employer] of Occupational Health - Occupational Stress Questionnaire    Feeling of Stress : Not at all  Social Connections: Moderately Integrated (10/24/2023)   Social Connection and Isolation Panel [NHANES]    Frequency of Communication with Friends and Family: More than three times a week    Frequency of Social Gatherings with Friends and Family: Twice a week    Attends Religious Services: More than 4 times per year    Active Member  of Clubs or Organizations: Yes    Attends Banker Meetings: More than 4 times per year    Marital Status: Widowed  Intimate Partner Violence: Not on file    Outpatient Medications Prior to Visit  Medication Sig Dispense Refill   aluminum  chloride (DRYSOL) 20 % external solution Apply 1 application topically at bedtime. 35 mL 0   doxycycline  (VIBRA -TABS) 100 MG tablet Take 1 tablet (100 mg total) by mouth 2 (two) times daily with food and water . (sun warning) 60 tablet 3   Fluocinolone  Acetonide Scalp (DERMA-SMOOTHE /FS SCALP) 0.01 % OIL Apply to affected areas at bedtime. Wash upon waking if needed 118.28 mL 3   levocetirizine (XYZAL  ALLERGY 24HR) 5 MG tablet Take 1 tablet (5 mg total) by mouth every evening. 21 tablet 1   pimecrolimus  (ELIDEL ) 1 % cream Apply topically 2 (two) times daily. 30 g 5   triamcinolone   cream (KENALOG ) 0.1 % Apply 1 application topically as needed (cerebric dermatitis). 30 g 1   Vitamin D , Ergocalciferol , (DRISDOL ) 1.25 MG (50000 UNIT) CAPS capsule Take 1 capsule (50,000 Units total) by mouth every 7 (seven) days. 20 capsule 1   Ascorbic Acid (VITAMIN C) 1000 MG tablet Take 1,000 mg by mouth daily.     Biotin 5 MG CAPS Take 5 mg by mouth daily.     cephALEXin  (KEFLEX ) 500 MG capsule Take 1 capsule (500 mg total) by mouth 2 (two) times daily. 14 capsule 0   Cholecalciferol  (VITAMIN D ) 125 MCG (5000 UT) CAPS Take 5,000 Units by mouth daily.     Clindamycin -Benzoyl Per, Refr, gel Apply to affected areas topically in the morning. 45 g 3   fluticasone  (FLONASE ) 50 MCG/ACT nasal spray Place 2 sprays into both nostrils daily. 16 g 0   Multiple Vitamin (MULTIVITAMIN WITH MINERALS) TABS tablet Take 1 tablet by mouth daily.     Ruxolitinib Phosphate  (OPZELURA ) 1.5 % CREA Apply 1 Application topically 2 (two) times daily as needed. 60 g 5   No facility-administered medications prior to visit.    No Known Allergies  ROS Review of Systems  Constitutional:  Negative for chills and fever.  Eyes:  Negative for visual disturbance.  Respiratory:  Negative for chest tightness and shortness of breath.   Neurological:  Negative for dizziness and headaches.      Objective:    Physical Exam HENT:     Head: Normocephalic.     Mouth/Throat:     Mouth: Mucous membranes are moist.  Cardiovascular:     Rate and Rhythm: Normal rate.     Heart sounds: Normal heart sounds.  Pulmonary:     Effort: Pulmonary effort is normal.     Breath sounds: Normal breath sounds.  Neurological:     Mental Status: She is alert.     BP 134/87   Pulse 79   Resp 18   Ht 5\' 4"  (1.626 m)   Wt 165 lb 0.6 oz (74.9 kg)   SpO2 97%   BMI 28.33 kg/m  Wt Readings from Last 3 Encounters:  10/25/23 165 lb 0.6 oz (74.9 kg)  05/01/23 153 lb 8 oz (69.6 kg)  04/05/23 162 lb (73.5 kg)    Lab Results   Component Value Date   TSH 0.993 10/23/2022   Lab Results  Component Value Date   WBC 4.7 10/23/2022   HGB 13.0 10/23/2022   HCT 39.5 10/23/2022   MCV 95 10/23/2022   PLT 302 10/23/2022   Lab Results  Component Value Date   NA 138 10/23/2022   K 4.5 10/23/2022   CO2 22 10/23/2022   GLUCOSE 92 10/23/2022   BUN 9 10/23/2022   CREATININE 0.94 10/23/2022   BILITOT <0.2 10/23/2022   ALKPHOS 64 10/23/2022   AST 17 10/23/2022   ALT 20 10/23/2022   PROT 7.2 10/23/2022   ALBUMIN 4.5 10/23/2022   CALCIUM 9.9 10/23/2022   EGFR 75 10/23/2022   Lab Results  Component Value Date   CHOL 217 (H) 10/23/2022   Lab Results  Component Value Date   HDL 70 10/23/2022   Lab Results  Component Value Date   LDLCALC 129 (H) 10/23/2022   Lab Results  Component Value Date   TRIG 100 10/23/2022   Lab Results  Component Value Date   CHOLHDL 3.1 10/23/2022   Lab Results  Component Value Date   HGBA1C 6.0 (H) 10/23/2022      Assessment & Plan:  Prediabetes Assessment & Plan: Lifestyle modifications for prediabetes were discussed, including adopting a heart-healthy diet and increasing physical activity. The patient was encouraged to decrease her intake of high-sugar foods and beverages. She verbalized understanding and is aware of the plan of care.    Overweight (BMI 25.0-29.9) Assessment & Plan: For optimal results with weight loss, I recommend: Decreasing portion sizes. Reducing sugar, sodium, and carbohydrate intake, and limiting saturated fats in your diet. Increasing your fiber intake by incorporating more whole grains, fruits, and vegetables. Setting healthy goals and focusing on lowering carbs, sugar, and fat. Increasing the variety of fruits and vegetables in your diet. Reducing soda consumption and limiting processed foods. In addition to taking your weight loss medication, engage in moderate-intensity physical activity for at least 150 minutes per week for the best  results.    IFG (impaired fasting glucose) -     Hemoglobin A1c  Vitamin D  deficiency -     VITAMIN D  25 Hydroxy (Vit-D Deficiency, Fractures)  TSH (thyroid -stimulating hormone deficiency) -     TSH + free T4  Other hyperlipidemia -     Lipid panel -     CMP14+EGFR -     CBC with Differential/Platelet  Note: This chart has been completed using Engineer, civil (consulting) software, and while attempts have been made to ensure accuracy, certain words and phrases may not be transcribed as intended.    Follow-up: Return in about 2 weeks (around 11/08/2023) for physical exam.   Johnnell Liou, FNP

## 2023-10-25 NOTE — Assessment & Plan Note (Signed)
 For optimal results with weight loss, I recommend: Decreasing portion sizes. Reducing sugar, sodium, and carbohydrate intake, and limiting saturated fats in your diet. Increasing your fiber intake by incorporating more whole grains, fruits, and vegetables. Setting healthy goals and focusing on lowering carbs, sugar, and fat. Increasing the variety of fruits and vegetables in your diet. Reducing soda consumption and limiting processed foods. In addition to taking your weight loss medication, engage in moderate-intensity physical activity for at least 150 minutes per week for the best results.

## 2023-10-26 LAB — CBC WITH DIFFERENTIAL/PLATELET
Basophils Absolute: 0 10*3/uL (ref 0.0–0.2)
Basos: 0 %
EOS (ABSOLUTE): 0.1 10*3/uL (ref 0.0–0.4)
Eos: 2 %
Hematocrit: 37.3 % (ref 34.0–46.6)
Hemoglobin: 12.3 g/dL (ref 11.1–15.9)
Immature Grans (Abs): 0 10*3/uL (ref 0.0–0.1)
Immature Granulocytes: 0 %
Lymphocytes Absolute: 2.4 10*3/uL (ref 0.7–3.1)
Lymphs: 48 %
MCH: 31.5 pg (ref 26.6–33.0)
MCHC: 33 g/dL (ref 31.5–35.7)
MCV: 96 fL (ref 79–97)
Monocytes Absolute: 0.7 10*3/uL (ref 0.1–0.9)
Monocytes: 14 %
Neutrophils Absolute: 1.8 10*3/uL (ref 1.4–7.0)
Neutrophils: 36 %
Platelets: 296 10*3/uL (ref 150–450)
RBC: 3.9 x10E6/uL (ref 3.77–5.28)
RDW: 13.1 % (ref 11.7–15.4)
WBC: 5 10*3/uL (ref 3.4–10.8)

## 2023-10-26 LAB — HEMOGLOBIN A1C
Est. average glucose Bld gHb Est-mCnc: 123 mg/dL
Hgb A1c MFr Bld: 5.9 % — ABNORMAL HIGH (ref 4.8–5.6)

## 2023-10-26 LAB — CMP14+EGFR
ALT: 19 IU/L (ref 0–32)
AST: 18 IU/L (ref 0–40)
Albumin: 4.1 g/dL (ref 3.9–4.9)
Alkaline Phosphatase: 65 IU/L (ref 44–121)
BUN/Creatinine Ratio: 10 (ref 9–23)
BUN: 9 mg/dL (ref 6–24)
Bilirubin Total: 0.3 mg/dL (ref 0.0–1.2)
CO2: 21 mmol/L (ref 20–29)
Calcium: 9.4 mg/dL (ref 8.7–10.2)
Chloride: 100 mmol/L (ref 96–106)
Creatinine, Ser: 0.9 mg/dL (ref 0.57–1.00)
Globulin, Total: 2.8 g/dL (ref 1.5–4.5)
Glucose: 92 mg/dL (ref 70–99)
Potassium: 4 mmol/L (ref 3.5–5.2)
Sodium: 137 mmol/L (ref 134–144)
Total Protein: 6.9 g/dL (ref 6.0–8.5)
eGFR: 79 mL/min/{1.73_m2} (ref >59)

## 2023-10-26 LAB — LIPID PANEL
Chol/HDL Ratio: 3.4 ratio (ref 0.0–4.4)
Cholesterol, Total: 209 mg/dL — ABNORMAL HIGH (ref 100–199)
HDL: 62 mg/dL (ref >39)
LDL Chol Calc (NIH): 127 mg/dL — ABNORMAL HIGH (ref 0–99)
Triglycerides: 115 mg/dL (ref 0–149)
VLDL Cholesterol Cal: 20 mg/dL (ref 5–40)

## 2023-10-26 LAB — VITAMIN D 25 HYDROXY (VIT D DEFICIENCY, FRACTURES): Vit D, 25-Hydroxy: 33.9 ng/mL (ref 30.0–100.0)

## 2023-10-26 LAB — TSH+FREE T4
Free T4: 1.11 ng/dL (ref 0.82–1.77)
TSH: 1.11 u[IU]/mL (ref 0.450–4.500)

## 2023-10-29 NOTE — Progress Notes (Signed)
 Please inform the patient that her labs indicate she is prediabetic. I recommend decreasing her intake of high-sugar foods and beverages and increasing her physical activity. Her cholesterol levels are elevated, and I recommend implementing lifestyle changes by decreasing her intake of greasy, fatty, and starchy foods, along with increasing her physical activity.

## 2023-10-30 ENCOUNTER — Ambulatory Visit (INDEPENDENT_AMBULATORY_CARE_PROVIDER_SITE_OTHER): Admitting: Family Medicine

## 2023-10-30 ENCOUNTER — Encounter: Payer: Self-pay | Admitting: Family Medicine

## 2023-10-30 VITALS — BP 152/88 | HR 67 | Ht 64.0 in | Wt 166.0 lb

## 2023-10-30 DIAGNOSIS — Z0001 Encounter for general adult medical examination with abnormal findings: Secondary | ICD-10-CM | POA: Diagnosis not present

## 2023-10-30 NOTE — Assessment & Plan Note (Signed)
 A comprehensive physical examination was completed Screening and health maintenance recommendations have been updated. The patient received counseling on exercise and nutrition. BMI was assessed and discussed Advise for heart health, focus on: Eat more fruits and vegetables: Aim for a variety of colors. Choose whole grains: Brown rice, oats, and whole-wheat bread. Limit unhealthy fats: Avoid trans fats; use olive or avocado oil instead. Include lean proteins: Opt for fish, chicken, beans, and legumes. Reduce sodium: Limit processed foods and add less salt. Stay hydrated: Drink plenty of water . Exercise regularly: Aim for at least 30 minutes of moderate exercise, like walking or cycling, 5 days a week.

## 2023-10-30 NOTE — Patient Instructions (Signed)

## 2023-10-30 NOTE — Progress Notes (Signed)
 Complete physical exam  Patient: Jennifer Hansen   DOB: 1976-02-28   48 y.o. Female  MRN: [id number]  Subjective:    Chief Complaint  Patient presents with   Annual Exam    CPE    Jennifer Hansen is a 48 y.o. female who presents today for a complete physical exam. She reports consuming a general diet. The patient does not participate in regular exercise at present. She generally feels well. She reports sleeping well. She does have additional problems to discuss today.    Most recent fall risk assessment:    10/30/2023    9:13 AM  Fall Risk   Falls in the past year? 0  Number falls in past yr: 0  Injury with Fall? 0  Risk for fall due to : No Fall Risks  Follow up Falls evaluation completed     Most recent depression screenings:    10/30/2023    9:13 AM 10/25/2023    8:05 AM  PHQ 2/9 Scores  PHQ - 2 Score 0 0    Vision:Within last year and Dental: No current dental problems and Receives regular dental care  Patient Care Team: Del Amber Bail, Rogerio Clay, FNP as PCP - General (Family Medicine) Riley Cheadle, Windsor Hatcher, MD as Consulting Physician (Gastroenterology)   Outpatient Medications Prior to Visit  Medication Sig   doxycycline  (VIBRA -TABS) 100 MG tablet Take 1 tablet (100 mg total) by mouth 2 (two) times daily with food and water . (sun warning)   Fluocinolone  Acetonide Scalp (DERMA-SMOOTHE /FS SCALP) 0.01 % OIL Apply to affected areas at bedtime. Wash upon waking if needed   levocetirizine (XYZAL  ALLERGY 24HR) 5 MG tablet Take 1 tablet (5 mg total) by mouth every evening.   pimecrolimus  (ELIDEL ) 1 % cream Apply topically 2 (two) times daily.   triamcinolone  cream (KENALOG ) 0.1 % Apply 1 application topically as needed (cerebric dermatitis).   Vitamin D , Ergocalciferol , (DRISDOL ) 1.25 MG (50000 UNIT) CAPS capsule Take 1 capsule (50,000 Units total) by mouth every 7 (seven) days.   [DISCONTINUED] aluminum  chloride (DRYSOL) 20 % external solution Apply 1 application topically at  bedtime.   [DISCONTINUED] Ascorbic Acid (VITAMIN C) 1000 MG tablet Take 1,000 mg by mouth daily.   [DISCONTINUED] Biotin 5 MG CAPS Take 5 mg by mouth daily.   [DISCONTINUED] cephALEXin  (KEFLEX ) 500 MG capsule Take 1 capsule (500 mg total) by mouth 2 (two) times daily.   [DISCONTINUED] Cholecalciferol  (VITAMIN D ) 125 MCG (5000 UT) CAPS Take 5,000 Units by mouth daily.   [DISCONTINUED] Clindamycin -Benzoyl Per, Refr, gel Apply to affected areas topically in the morning.   [DISCONTINUED] fluticasone  (FLONASE ) 50 MCG/ACT nasal spray Place 2 sprays into both nostrils daily.   [DISCONTINUED] Multiple Vitamin (MULTIVITAMIN WITH MINERALS) TABS tablet Take 1 tablet by mouth daily.   [DISCONTINUED] Ruxolitinib Phosphate  (OPZELURA ) 1.5 % CREA Apply 1 Application topically 2 (two) times daily as needed.   No facility-administered medications prior to visit.    Review of Systems  Constitutional:  Negative for chills and fever.  Eyes:  Negative for blurred vision.  Respiratory:  Negative for shortness of breath.   Cardiovascular:  Negative for chest pain.  Gastrointestinal:  Negative for abdominal pain, constipation and diarrhea.  Genitourinary:  Negative for dysuria.  Musculoskeletal:  Negative for joint pain.  Neurological:  Negative for dizziness and headaches.       Objective:    BP (!) 152/88   Pulse 67   Ht 5\' 4"  (1.626 m)   Wt  166 lb 0.6 oz (75.3 kg)   LMP 10/17/2023 (Exact Date)   SpO2 97%   BMI 28.50 kg/m  BP Readings from Last 3 Encounters:  10/30/23 (!) 152/88  10/25/23 134/87  05/01/23 120/80      Physical Exam Vitals reviewed.  Constitutional:      General: She is not in acute distress.    Appearance: Normal appearance. She is not ill-appearing, toxic-appearing or diaphoretic.  HENT:     Head: Normocephalic.     Right Ear: Tympanic membrane normal.     Left Ear: Tympanic membrane normal.     Nose: Nose normal.     Mouth/Throat:     Mouth: Mucous membranes are moist.   Eyes:     General:        Right eye: No discharge.        Left eye: No discharge.     Conjunctiva/sclera: Conjunctivae normal.     Pupils: Pupils are equal, round, and reactive to light.  Cardiovascular:     Rate and Rhythm: Normal rate.     Pulses: Normal pulses.     Heart sounds: Normal heart sounds.  Pulmonary:     Effort: Pulmonary effort is normal. No respiratory distress.     Breath sounds: Normal breath sounds.  Abdominal:     General: Bowel sounds are normal.     Palpations: Abdomen is soft.     Tenderness: There is no abdominal tenderness. There is no right CVA tenderness, left CVA tenderness or guarding.  Musculoskeletal:        General: Normal range of motion.     Cervical back: Normal range of motion.  Skin:    General: Skin is warm and dry.     Capillary Refill: Capillary refill takes less than 2 seconds.  Neurological:     Mental Status: She is alert.     Coordination: Coordination normal.     Gait: Gait normal.  Psychiatric:        Mood and Affect: Mood normal.        Behavior: Behavior normal.      No results found for any visits on 10/30/23.    Assessment & Plan:    Routine Health Maintenance and Physical Exam  Immunization History  Administered Date(s) Administered   Influenza Split 04/01/2013   Influenza, Seasonal, Injecte, Preservative Fre 04/16/2023   Influenza-Unspecified 03/02/2014, 03/24/2015, 04/13/2016, 04/08/2017, 04/21/2018, 04/13/2019, 04/12/2020, 04/11/2021, 04/05/2022, 04/16/2023   PFIZER(Purple Top)SARS-COV-2 Vaccination 07/11/2019, 07/30/2019, 06/21/2020   Tdap 09/04/2005, 06/04/2016    Health Maintenance  Topic Date Due   COVID-19 Vaccine (4 - 2024-25 season) 11/15/2023 (Originally 03/03/2023)   INFLUENZA VACCINE  01/31/2024   Cervical Cancer Screening (HPV/Pap Cotest)  10/11/2025   DTaP/Tdap/Td (3 - Td or Tdap) 06/04/2026   Colonoscopy  12/08/2030   Hepatitis C Screening  Completed   HIV Screening  Completed   HPV VACCINES   Aged Out   Meningococcal B Vaccine  Aged Out    Discussed health benefits of physical activity, and encouraged her to engage in regular exercise appropriate for her age and condition.  There are no diagnoses linked to this encounter.  Return in about 1 year (around 10/29/2024), or if symptoms worsen or fail to improve, for Pap smear, Annual Physical.     Avelino Lek Amber Bail, FNP

## 2023-12-30 ENCOUNTER — Telehealth: Admitting: Physician Assistant

## 2023-12-30 ENCOUNTER — Other Ambulatory Visit (HOSPITAL_COMMUNITY): Payer: Self-pay

## 2023-12-30 DIAGNOSIS — R3989 Other symptoms and signs involving the genitourinary system: Secondary | ICD-10-CM

## 2023-12-30 MED ORDER — NITROFURANTOIN MONOHYD MACRO 100 MG PO CAPS
100.0000 mg | ORAL_CAPSULE | Freq: Two times a day (BID) | ORAL | 0 refills | Status: DC
  Filled 2023-12-30: qty 10, 5d supply, fill #0

## 2023-12-30 NOTE — Progress Notes (Signed)

## 2024-01-14 ENCOUNTER — Ambulatory Visit: Payer: Self-pay

## 2024-01-14 ENCOUNTER — Encounter (HOSPITAL_COMMUNITY): Payer: Self-pay | Admitting: Pharmacy Technician

## 2024-01-14 ENCOUNTER — Emergency Department (HOSPITAL_COMMUNITY)
Admission: EM | Admit: 2024-01-14 | Discharge: 2024-01-15 | Discharge status: Against Medical Advice | Attending: Emergency Medicine | Admitting: Emergency Medicine

## 2024-01-14 ENCOUNTER — Other Ambulatory Visit: Payer: Self-pay

## 2024-01-14 VITALS — BP 134/84 | HR 90 | Ht 64.0 in | Wt 162.0 lb

## 2024-01-14 DIAGNOSIS — L02419 Cutaneous abscess of limb, unspecified: Secondary | ICD-10-CM

## 2024-01-14 DIAGNOSIS — Z5321 Procedure and treatment not carried out due to patient leaving prior to being seen by health care provider: Secondary | ICD-10-CM | POA: Insufficient documentation

## 2024-01-14 DIAGNOSIS — L02416 Cutaneous abscess of left lower limb: Secondary | ICD-10-CM | POA: Insufficient documentation

## 2024-01-14 DIAGNOSIS — L03119 Cellulitis of unspecified part of limb: Secondary | ICD-10-CM

## 2024-01-14 NOTE — Progress Notes (Unsigned)
   Established Patient Office Visit  Subjective   Patient ID: Jennifer Hansen, female    DOB: 1975/09/10  Age: 48 y.o. MRN: [id number]  Chief Complaint  Patient presents with   Medical Management of Chronic Issues    Pt here for boil or ingrown hair on inside of her thigh since saturday    HPI  Patient Active Problem List   Diagnosis Date Noted   Rash of unknown etiology 04/06/2023   Elevated BP without diagnosis of hypertension 10/23/2022   Encounter for routine adult physical exam with abnormal findings 10/18/2021   Skin mole 10/18/2021   Overweight (BMI 25.0-29.9) 08/03/2021   Prediabetes 10/12/2020   Dermoid cyst of leg, left 10/11/2020   Bunion 11/18/2015      ROS    Objective:     BP 134/84   Pulse 90   Ht 5' 4 (1.626 m)   Wt 162 lb (73.5 kg)   SpO2 96%   BMI 27.81 kg/m  BP Readings from Last 3 Encounters:  01/14/24 134/84  10/30/23 (!) 152/88  10/25/23 134/87   Wt Readings from Last 3 Encounters:  01/14/24 162 lb (73.5 kg)  10/30/23 166 lb 0.6 oz (75.3 kg)  10/25/23 165 lb 0.6 oz (74.9 kg)     Physical Exam   No results found for any visits on 01/14/24.  {Labs (Optional):23779}  The 10-year ASCVD risk score (Arnett DK, et al., 2019) is: 1.6%    Assessment & Plan:   Problem List Items Addressed This Visit   None   No follow-ups on file.    Leita Longs, FNP

## 2024-01-14 NOTE — ED Triage Notes (Signed)
 PT arrives via POV. PT reports abscess to her left inner thigh since this past Saturday. Denies fever. No drainage. Pt AxOx4.

## 2024-01-15 ENCOUNTER — Ambulatory Visit: Payer: Self-pay

## 2024-01-15 ENCOUNTER — Telehealth: Admitting: Physician Assistant

## 2024-01-15 ENCOUNTER — Other Ambulatory Visit: Payer: Self-pay

## 2024-01-15 ENCOUNTER — Other Ambulatory Visit: Payer: Self-pay | Admitting: Family Medicine

## 2024-01-15 DIAGNOSIS — L03116 Cellulitis of left lower limb: Secondary | ICD-10-CM

## 2024-01-15 DIAGNOSIS — L02416 Cutaneous abscess of left lower limb: Secondary | ICD-10-CM

## 2024-01-15 MED ORDER — DOXYCYCLINE HYCLATE 100 MG PO TABS: 100.0000 mg | ORAL_TABLET | Freq: Two times a day (BID) | ORAL | 0 refills | Status: AC

## 2024-01-15 MED ORDER — SULFAMETHOXAZOLE-TRIMETHOPRIM 800-160 MG PO TABS: 1.0000 | ORAL_TABLET | Freq: Two times a day (BID) | ORAL | 0 refills | Status: AC

## 2024-01-15 NOTE — Patient Instructions (Signed)
  Jennifer Hansen, thank you for joining Jennifer Velma Lunger, PA-C for today's virtual visit.  While this provider is not your primary care provider (PCP), if your PCP is located in our provider database this encounter information will be shared with them immediately following your visit.   A [facility] MyChart account gives you access to today's visit and all your visits, tests, and labs performed at [facility]  click here if you don't have a [facility] MyChart account or go to [email or web address]  Consent: (Patient) Jennifer Hansen provided verbal consent for this virtual visit at the beginning of the encounter.  Current Medications:  Current Outpatient Medications:    doxycycline  (VIBRA -TABS) 100 MG tablet, Take 1 tablet (100 mg total) by mouth 2 (two) times daily with food and water . (sun warning), Disp: 60 tablet, Rfl: 3   Fluocinolone  Acetonide Scalp (DERMA-SMOOTHE /FS SCALP) 0.01 % OIL, Apply to affected areas at bedtime. Wash upon waking if needed, Disp: 118.28 mL, Rfl: 3   levocetirizine (XYZAL  ALLERGY 24HR) 5 MG tablet, Take 1 tablet (5 mg total) by mouth every evening., Disp: 21 tablet, Rfl: 1   nitrofurantoin , macrocrystal-monohydrate, (MACROBID ) 100 MG capsule, Take 1 capsule (100 mg total) by mouth 2 (two) times daily., Disp: 10 capsule, Rfl: 0   pimecrolimus  (ELIDEL ) 1 % cream, Apply topically 2 (two) times daily., Disp: 30 g, Rfl: 5   triamcinolone  cream (KENALOG ) 0.1 %, Apply 1 application topically as needed (cerebric dermatitis)., Disp: 30 g, Rfl: 1   Vitamin D , Ergocalciferol , (DRISDOL ) 1.25 MG (50000 UNIT) CAPS capsule, Take 1 capsule (50,000 Units total) by mouth every 7 (seven) days., Disp: 20 capsule, Rfl: 1   Medications ordered in this encounter:  No orders of the defined types were placed in this encounter.    *If you need refills on other medications prior to your next appointment, please contact your pharmacy*  Follow-Up: Call back or seek  an in-person evaluation if the symptoms worsen or if the condition fails to improve as anticipated.  [facility] [phone]  Other Instructions Please keep the area clean and dry. Continue warm compresses for 5-10 minutes, a few times per day. Take the Doxycycline  twice daily as directed. Ok to alternate Tylenol and Motrin  as needed for pain. If you note any non-resolving, new, or worsening symptoms despite treatment, please seek an in-person evaluation ASAP.    If you have been instructed to have an in-person evaluation today at a local Urgent Care facility, please use the link below. It will take you to a list of all of our available [facility], including address, phone number and hours of operation. Please do not delay care.  [facility]  If you or a family member do not have a primary care provider, use the link below to schedule a visit and establish care. When you choose a [facility] primary care physician or advanced practice provider, you gain a long-term partner in health. Find a Primary Care Provider  Learn more about [facility]'s in-office and virtual care options:  - Get Care Now

## 2024-01-15 NOTE — Telephone Encounter (Signed)
 FYI Only or Action Required?: FYI only for provider.  Patient was last seen in primary care on 01/14/2024 by Bevely Doffing, FNP.  Called Nurse Triage reporting [place].  Symptoms began x 2 days ago, abscess draining x 1 day .  Interventions attempted: Nothing.  Symptoms are: stable.  Triage Disposition: See Physician Within 24 Hours  Patient/caregiver understands and will follow disposition?:   Due to no appt. [facility]ail[facility] Virtual UC visit scheduled for this morning 7/16 at 11:45 am for immediate evaluation/treatmentTHORA Franks from CRM #[id number]. Topic: Clinical - Medical Advice >> Jan 15, 2024  9:56 AM Tonda B wrote: Reason for CRM: patient came in yesterday for an abscess on inner thigh nothing was done but it is starting to drain and pt has questions concerning the abscess Reason for Disposition  [1] Boil > 1/2 inch across (> 12 mm; larger than a marble) AND [2] center is soft or pus colored  Answer Assessment - Initial Assessment Questions 1. APPEARANCE of BOIL: What does the boil look like?      Red, puffy, white drainage, bubble sac  2. LOCATION: Where is the boil located?      Inner left thigh  3. NUMBER: How many boils are there?      One   4. SIZE: How big is the boil? (e.g., inches, cm; compare to size of a coin or other object)     Size is decreasing from yesterday  5. ONSET: When did the boil start?     X 2 days ago  6. PAIN: Is there any pain? If Yes, ask: How bad is the pain?   (Scale 1-10; or mild, moderate, severe)     Moderate, pain is better than yesterday  7. FEVER: Do you have a fever? If Yes, ask: What is it, how was it measured, and when did it start?      No   8. SOURCE: Have you been around anyone with boils or other Staph infections? Have you ever had boils before?     Unknown   9. OTHER SYMPTOMS: Do you have any other symptoms? (e.g., shaking chills, weakness, rash elsewhere on body)     No     Patient was seen by PCP on 7/15, and was told to be seen in the ED for abscess. She states nothing was done in the ED, as the wait was so long she went home. It is now draining. Due to no appt. Availability, pt is scheduled for virtual UC visit this morning.  Protocols used: Boil (Skin Abscess)-A-AH

## 2024-01-15 NOTE — Progress Notes (Signed)
 Virtual Visit Consent   Jennifer Hansen, you are scheduled for a virtual visit with a [facility] provider today. Just as with appointments in the office, your consent must be obtained to participate. Your consent will be active for this visit and any virtual visit you may have with one of our providers in the next 365 days. If you have a MyChart account, a copy of this consent can be sent to you electronically.  As this is a virtual visit, video technology does not allow for your provider to perform a traditional examination. This may limit your provider's ability to fully assess your condition. If your provider identifies any concerns that need to be evaluated in person or the need to arrange testing (such as labs, EKG, etc.), we will make arrangements to do so. Although advances in technology are sophisticated, we cannot ensure that it will always work on either your end or our end. If the connection with a video visit is poor, the visit may have to be switched to a telephone visit. With either a video or telephone visit, we are not always able to ensure that we have a secure connection.  By engaging in this virtual visit, you consent to the provision of healthcare and authorize for your insurance to be billed (if applicable) for the services provided during this visit. Depending on your insurance coverage, you may receive a charge related to this service.  I need to obtain your verbal consent now. Are you willing to proceed with your visit today? Jennifer Hansen Student has provided verbal consent on 01/15/2024 for a virtual visit (video or telephone). Jennifer Hansen, [place]  Date: 01/15/2024 11:53 AM   Virtual Visit via Video Note   I, Jennifer Hansen, connected with  Jennifer Hansen  ([id number], [birth year]) on 01/15/24 at 11:45 AM EDT by a video-enabled telemedicine application and verified that I am speaking with the correct person using two identifiers.  Location: Patient: Virtual Visit  Location Patient: Home Provider: Virtual Visit Location Provider: Home Office   I discussed the limitations of evaluation and management by telemedicine and the availability of in person appointments. The patient expressed understanding and agreed to proceed.    History of Present Illness: Jennifer Hansen is a 48 y.o. who identifies as a female who was assigned female at birth, and is being seen today for possible abscess and cellulitis of inner thigh. Endorses first noting symptoms Saturday of last week with a mild irritated bump. Thought was nothing worrisome but by the end of the weekend with continued worsening of pain and tenderness with swelling. SABRA Sous to PCP office 7/15 and was told they did not have anything to drain with so was sent to the ER. Notes waiting at [facility] ER on 7/15 for 10 hours without being seen so went home. Notes as of yesterday the area started draining on its own which has helped with pressure and pain. Denies fever, chills,    HPI: HPI  Problems:  Patient Active Problem List   Diagnosis Date Noted   Rash of unknown etiology 04/06/2023   Elevated BP without diagnosis of hypertension 10/23/2022   Encounter for routine adult physical exam with abnormal findings 10/18/2021   Skin mole 10/18/2021   Overweight (BMI 25.0-29.9) 08/03/2021   Prediabetes 10/12/2020   Dermoid cyst of leg, left 10/11/2020   Bunion 11/18/2015    Allergies:  Allergies  Allergen Reactions   Thimerosal (Thiomersal)    Medications:  Current Outpatient  Medications:    doxycycline  (VIBRA -TABS) 100 MG tablet, Take 1 tablet (100 mg total) by mouth 2 (two) times daily., Disp: 20 tablet, Rfl: 0   doxycycline  (VIBRA -TABS) 100 MG tablet, Take 1 tablet (100 mg total) by mouth 2 (two) times daily with food and water . (sun warning), Disp: 60 tablet, Rfl: 3   Fluocinolone  Acetonide Scalp (DERMA-SMOOTHE /FS SCALP) 0.01 % OIL, Apply to affected areas at bedtime. Wash upon waking if needed, Disp: 118.28 mL,  Rfl: 3   levocetirizine (XYZAL  ALLERGY 24HR) 5 MG tablet, Take 1 tablet (5 mg total) by mouth every evening., Disp: 21 tablet, Rfl: 1   pimecrolimus  (ELIDEL ) 1 % cream, Apply topically 2 (two) times daily., Disp: 30 g, Rfl: 5   triamcinolone  cream (KENALOG ) 0.1 %, Apply 1 application topically as needed (cerebric dermatitis)., Disp: 30 g, Rfl: 1   Vitamin D , Ergocalciferol , (DRISDOL ) 1.25 MG (50000 UNIT) CAPS capsule, Take 1 capsule (50,000 Units total) by mouth every 7 (seven) days., Disp: 20 capsule, Rfl: 1  Observations/Objective: Patient is well-developed, well-nourished in no acute distress.  Resting comfortably  at home.  Head is normocephalic, atraumatic.  No labored breathing.  Speech is clear and coherent with logical content.  Patient is alert and oriented at baseline.  L upper thigh, medial with noted silver-dollar sized area of abscess... no noted active fluctuance superficially on visual inspection. Is draining on its own. There is about 5-6 cm further expansion of erythema around this abscess. No streaking noted.  Assessment and Plan: 1. Abscess of left thigh (Primary) - doxycycline  (VIBRA -TABS) 100 MG tablet; Take 1 tablet (100 mg total) by mouth 2 (two) times daily.  Dispense: 20 tablet; Refill: 0  2. Cellulitis of left thigh - doxycycline  (VIBRA -TABS) 100 MG tablet; Take 1 tablet (100 mg total) by mouth 2 (two) times daily.  Dispense: 20 tablet; Refill: 0  Afebrile. Draining on its own with reduction in pain and swelling. Concern for surrounding cellulitis. Supportive measures including compresses, and OTC medications reviewed. Start Doxycycline  100 mg BID x 10 days. Strict in person precautions reviewed.  Follow Up Instructions: I discussed the assessment and treatment plan with the patient. The patient was provided an opportunity to ask questions and all were answered. The patient agreed with the plan and demonstrated an understanding of the instructions.  A copy of  instructions were sent to the patient via MyChart unless otherwise noted below.   The patient was advised to call back or seek an in-person evaluation if the symptoms worsen or if the condition fails to improve as anticipated.    Jennifer Velma Lunger, PA-C

## 2024-01-15 NOTE — ED Notes (Signed)
 LWBS
